# Patient Record
Sex: Female | Born: 2000 | Race: Black or African American | Hispanic: No | Marital: Single | Smoking: Former smoker
Health system: Southern US, Community
[De-identification: ages and names within clinical notes are randomized; demographics above are authoritative.]

## PROBLEM LIST (undated history)

## (undated) DIAGNOSIS — Z789 Other specified health status: Secondary | ICD-10-CM

## (undated) DIAGNOSIS — Z332 Encounter for elective termination of pregnancy: Secondary | ICD-10-CM

## (undated) DIAGNOSIS — J40 Bronchitis, not specified as acute or chronic: Secondary | ICD-10-CM

## (undated) HISTORY — PX: HERNIA REPAIR: SHX51

---
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
## 2019-01-20 ENCOUNTER — Emergency Department (HOSPITAL_COMMUNITY): Admission: EM | Admit: 2019-01-20 | Discharge: 2019-01-20 | Discharge status: Against Medical Advice | Payer: Self-pay

## 2019-09-19 ENCOUNTER — Emergency Department (HOSPITAL_COMMUNITY)
Admission: EM | Admit: 2019-09-19 | Discharge: 2019-09-19 | Disposition: A | Discharge status: Home / Self Care | Payer: Self-pay | Attending: Emergency Medicine | Admitting: Emergency Medicine

## 2019-09-19 ENCOUNTER — Encounter (HOSPITAL_COMMUNITY): Payer: Self-pay | Admitting: Emergency Medicine

## 2019-09-19 DIAGNOSIS — Z5321 Procedure and treatment not carried out due to patient leaving prior to being seen by health care provider: Secondary | ICD-10-CM | POA: Insufficient documentation

## 2019-09-19 DIAGNOSIS — N898 Other specified noninflammatory disorders of vagina: Secondary | ICD-10-CM | POA: Insufficient documentation

## 2019-09-19 NOTE — ED Triage Notes (Signed)
Pt reports she was treated for gonorrhea in January and states the discharge has not change of gotten better. Pt reports a white thin discharge, no odor. Denies any abd pain no urinary sxs.

## 2019-09-19 NOTE — ED Notes (Signed)
Pt states that she is leaving due to wait time.  

## 2019-10-25 ENCOUNTER — Inpatient Hospital Stay (HOSPITAL_COMMUNITY)
Admission: AD | Admit: 2019-10-25 | Discharge: 2019-10-25 | Disposition: A | Discharge status: Home / Self Care | Payer: Medicaid Other | Attending: Obstetrics & Gynecology | Admitting: Obstetrics & Gynecology

## 2019-10-25 ENCOUNTER — Other Ambulatory Visit: Payer: Self-pay

## 2019-10-25 DIAGNOSIS — R109 Unspecified abdominal pain: Secondary | ICD-10-CM | POA: Diagnosis not present

## 2019-10-25 LAB — POCT PREGNANCY, URINE: Preg Test, Ur: NEGATIVE

## 2019-10-25 NOTE — Progress Notes (Signed)
None     [name] is a 19 y.o. G1P0 non-pregnant female who presents to MAU today with complaint of abdominal pain.   O There were no vitals taken for this visit. Physical Exam  Constitutional: She appears well-developed and well-nourished. No distress.  Eyes: No scleral icterus.  Respiratory: Effort normal. No respiratory distress.  Neurological: She is alert. Coordination normal.  Skin: Skin is warm and dry. She is not diaphoretic.  Psychiatric: She has a normal mood and affect.    A Non pregnant female Medical screening exam complete  P Discharge from MAU in stable condition Patient given the option of transfer to [facility] for further evaluation or seek care in outpatient facility of choice List of options for follow-up given  Warning signs for worsening condition that would warrant emergency follow-up discussed Patient may return to MAU as needed for pregnancy related complaints  Venora Maples, MD 10/25/2019 4:23 AM

## 2019-11-28 ENCOUNTER — Emergency Department (HOSPITAL_COMMUNITY): Payer: Medicaid Other

## 2019-11-28 ENCOUNTER — Encounter (HOSPITAL_COMMUNITY): Payer: Self-pay | Admitting: Emergency Medicine

## 2019-11-28 ENCOUNTER — Emergency Department (HOSPITAL_COMMUNITY)
Admission: EM | Admit: 2019-11-28 | Discharge: 2019-11-28 | Disposition: A | Discharge status: Home / Self Care | Payer: Medicaid Other | Attending: Emergency Medicine | Admitting: Emergency Medicine

## 2019-11-28 DIAGNOSIS — R102 Pelvic and perineal pain: Secondary | ICD-10-CM

## 2019-11-28 DIAGNOSIS — R1011 Right upper quadrant pain: Secondary | ICD-10-CM | POA: Diagnosis present

## 2019-11-28 DIAGNOSIS — R1110 Vomiting, unspecified: Secondary | ICD-10-CM | POA: Insufficient documentation

## 2019-11-28 DIAGNOSIS — J302 Other seasonal allergic rhinitis: Secondary | ICD-10-CM | POA: Insufficient documentation

## 2019-11-28 DIAGNOSIS — R197 Diarrhea, unspecified: Secondary | ICD-10-CM | POA: Diagnosis not present

## 2019-11-28 DIAGNOSIS — R109 Unspecified abdominal pain: Secondary | ICD-10-CM

## 2019-11-28 DIAGNOSIS — M545 Low back pain: Secondary | ICD-10-CM | POA: Insufficient documentation

## 2019-11-28 LAB — URINALYSIS, ROUTINE W REFLEX MICROSCOPIC
Bilirubin Urine: NEGATIVE
Glucose, UA: NEGATIVE mg/dL
Hgb urine dipstick: NEGATIVE
Ketones, ur: NEGATIVE mg/dL
Nitrite: NEGATIVE
Protein, ur: NEGATIVE mg/dL
Specific Gravity, Urine: 1.027 (ref 1.005–1.030)
pH: 5 (ref 5.0–8.0)

## 2019-11-28 LAB — LIPASE, BLOOD: Lipase: 30 U/L (ref 11–51)

## 2019-11-28 LAB — CBC
HCT: 41.7 % (ref 36.0–46.0)
Hemoglobin: 13.3 g/dL (ref 12.0–15.0)
MCH: 27.4 pg (ref 26.0–34.0)
MCHC: 31.9 g/dL (ref 30.0–36.0)
MCV: 86 fL (ref 80.0–100.0)
Platelets: 286 10*3/uL (ref 150–400)
RBC: 4.85 MIL/uL (ref 3.87–5.11)
RDW: 12.5 % (ref 11.5–15.5)
WBC: 11.1 10*3/uL — ABNORMAL HIGH (ref 4.0–10.5)
nRBC: 0 % (ref 0.0–0.2)

## 2019-11-28 LAB — COMPREHENSIVE METABOLIC PANEL
ALT: 19 U/L (ref 0–44)
AST: 20 U/L (ref 15–41)
Albumin: 3.9 g/dL (ref 3.5–5.0)
Alkaline Phosphatase: 48 U/L (ref 38–126)
Anion gap: 8 (ref 5–15)
BUN: 13 mg/dL (ref 6–20)
CO2: 23 mmol/L (ref 22–32)
Calcium: 9.2 mg/dL (ref 8.9–10.3)
Chloride: 106 mmol/L (ref 98–111)
Creatinine, Ser: 0.68 mg/dL (ref 0.44–1.00)
GFR calc Af Amer: >60 mL/min (ref >60)
GFR calc non Af Amer: >60 mL/min (ref >60)
Glucose, Bld: 85 mg/dL (ref 70–99)
Potassium: 4.1 mmol/L (ref 3.5–5.1)
Sodium: 137 mmol/L (ref 135–145)
Total Bilirubin: 1.2 mg/dL (ref 0.3–1.2)
Total Protein: 7.5 g/dL (ref 6.5–8.1)

## 2019-11-28 LAB — WET PREP, GENITAL
Clue Cells Wet Prep HPF POC: NONE SEEN
Sperm: NONE SEEN
Trich, Wet Prep: NONE SEEN
Yeast Wet Prep HPF POC: NONE SEEN

## 2019-11-28 LAB — I-STAT BETA HCG BLOOD, ED (MC, WL, AP ONLY): I-stat hCG, quantitative: <5 m[IU]/mL (ref <5)

## 2019-11-28 LAB — RPR: RPR Ser Ql: NONREACTIVE

## 2019-11-28 MED ORDER — ONDANSETRON 4 MG PO TBDP: 4.0000 mg | ORAL_TABLET | Freq: Three times a day (TID) | ORAL | 0 refills | Status: DC | PRN

## 2019-11-28 MED ORDER — SODIUM CHLORIDE 0.9% FLUSH: 3.0000 mL | Freq: Once | INTRAVENOUS | Status: DC

## 2019-11-28 MED ORDER — LORATADINE 10 MG PO TABS: 10.0000 mg | ORAL_TABLET | Freq: Every day | ORAL | 0 refills | Status: DC

## 2019-11-28 NOTE — Discharge Instructions (Signed)
If you develop worsening, continued, or recurrent abdominal pain, uncontrolled vomiting, fever, chest or back pain, or any other new/concerning symptoms then return to the ER for evaluation.  

## 2019-11-28 NOTE — ED Notes (Signed)
Patient verbalizes understanding of discharge instructions. Opportunity for questioning and answers were provided. Armband removed by staff, pt discharged from ED. Ambulated out to lobby  

## 2019-11-28 NOTE — ED Provider Notes (Signed)
[facility] EMERGENCY DEPARTMENT Provider Note   CSN: [id number] Arrival date & time: 11/28/19  0753     History Chief Complaint  Patient presents with  . Abdominal Pain    Samantha Peters is a 19 y.o. female.  HPI 19 year old female presents with vomiting and right sided abdominal pain.  Vomiting and pain started a couple days ago.  Vomiting is intermittent and mostly at night/early morning.  Abdominal pain seems to come and go and often is present when she is ambulatory.  It is right upper and right lower.  Some mild diarrhea.  No blood in her emesis or diarrhea.  No fevers.  She also has been having trouble with allergies which is a chronic problem but she is off medicines right now.  Is having a lot of congestion/sneezing.  No sore throat, cough, or fever.  No current abdominal pain. Some low back pain. No vaginal symptoms or urinary symptoms.  History reviewed. No pertinent past medical history.  Patient Active Problem List   Diagnosis Date Noted  . [redacted] weeks gestation of pregnancy   . Encounter for fetal anatomic survey   . Poor fetal growth affecting management of mother in third trimester, antepartum     Past Surgical History:  Procedure Laterality Date  . HERNIA REPAIR       OB History    Gravida  1   Para      Term      Preterm      AB      Living        SAB      TAB      Ectopic      Multiple      Live Births              No family history on file.  Social History   Tobacco Use  . Smoking status: Never Smoker  Substance Use Topics  . Alcohol use: No  . Drug use: No    Home Medications Prior to Admission medications   Medication Sig Start Date End Date Taking? Authorizing Provider  loratadine (CLARITIN) 10 MG tablet Take 1 tablet (10 mg total) by mouth daily. One po daily x 5 days 11/28/19   Pricilla Loveless, MD  ondansetron (ZOFRAN ODT) 4 MG disintegrating tablet Take 1 tablet (4 mg total) by mouth every 8 (eight)  hours as needed for nausea or vomiting. 11/28/19   Pricilla Loveless, MD    Allergies    Patient has no known allergies.  Review of Systems   Review of Systems  Constitutional: Negative for fever.  HENT: Positive for congestion and sneezing. Negative for sore throat.   Respiratory: Negative for cough and shortness of breath.   Gastrointestinal: Positive for abdominal pain, diarrhea and vomiting.  Genitourinary: Negative for dysuria, hematuria, menstrual problem, vaginal bleeding and vaginal discharge.  Musculoskeletal: Positive for back pain.  All other systems reviewed and are negative.   Physical Exam Updated Vital Signs BP (!) 125/52 (BP Location: Right Arm)   Pulse 82   Temp 98 F (36.7 C) (Oral)   Resp 16   SpO2 100%   Physical Exam Vitals and nursing note reviewed. Exam conducted with a chaperone present.  Constitutional:      General: She is not in acute distress.    Appearance: She is well-developed. She is not ill-appearing or diaphoretic.  HENT:     Head: Normocephalic and atraumatic.  Right Ear: External ear normal.     Left Ear: External ear normal.     Nose: Nose normal.  Eyes:     General:        Right eye: No discharge.        Left eye: No discharge.  Cardiovascular:     Rate and Rhythm: Normal rate and regular rhythm.     Heart sounds: Normal heart sounds.  Pulmonary:     Effort: Pulmonary effort is normal.     Breath sounds: Normal breath sounds.  Abdominal:     Palpations: Abdomen is soft.     Tenderness: There is abdominal tenderness (mild) in the right upper quadrant and right lower quadrant. There is no right CVA tenderness or left CVA tenderness.  Genitourinary:    Vagina: No vaginal discharge or tenderness.     Cervix: No cervical motion tenderness or discharge.     Uterus: Not tender.      Adnexa:        Right: No mass or tenderness.         Left: No mass or tenderness.       Comments: Cervix mildly erythematous Skin:    General: Skin  is warm and dry.  Neurological:     Mental Status: She is alert.  Psychiatric:        Mood and Affect: Mood is not anxious.     ED Results / Procedures / Treatments   Labs (all labs ordered are listed, but only abnormal results are displayed) Labs Reviewed  WET PREP, GENITAL - Abnormal; Notable for the following components:      Result Value   WBC, Wet Prep HPF POC MANY (*)    All other components within normal limits  CBC - Abnormal; Notable for the following components:   WBC 11.1 (*)    All other components within normal limits  URINALYSIS, ROUTINE W REFLEX MICROSCOPIC - Abnormal; Notable for the following components:   APPearance HAZY (*)    Leukocytes,Ua SMALL (*)    Bacteria, UA FEW (*)    All other components within normal limits  LIPASE, BLOOD  COMPREHENSIVE METABOLIC PANEL  RPR  HIV ANTIBODY (ROUTINE TESTING W REFLEX)  I-STAT BETA HCG BLOOD, ED ([facility], [facility], [facility] ONLY)  GC/CHLAMYDIA PROBE AMP ([place]) NOT AT [facility]    EKG None  Radiology US PELVIC COMPLETE W TRANSVAGINAL AND TORSION R/O  Result Date: 11/28/2019 CLINICAL DATA:  Right lower quadrant pain. EXAM: TRANSABDOMINAL AND TRANSVAGINAL ULTRASOUND OF PELVIS DOPPLER ULTRASOUND OF OVARIES TECHNIQUE: Both transabdominal and transvaginal ultrasound examinations of the pelvis were performed. Transabdominal technique was performed for global imaging of the pelvis including uterus, ovaries, adnexal regions, and pelvic cul-de-sac. It was necessary to proceed with endovaginal exam following the transabdominal exam to visualize the ovaries and adnexa. Color and duplex Doppler ultrasound was utilized to evaluate blood flow to the ovaries. COMPARISON:  Pelvic ultrasound dated August 17, 2015. FINDINGS: Uterus Measurements: 7.9 x 3.3 x 5.1 cm = volume: 70 mL. No fibroids or other mass visualized. Endometrium Thickness: 8 mm.  No focal abnormality visualized. Right ovary Measurements: 3.1 x 1.4 x 1.7 cm = volume: 4 mL. Normal  appearance/no adnexal mass. Left ovary Measurements: 3.2 x 2.1 x 2.3 cm = volume: 8 mL. Normal appearance/no adnexal mass. Pulsed Doppler evaluation of both ovaries demonstrates normal low-resistance arterial and venous waveforms. Other findings No abnormal free fluid. IMPRESSION: Normal pelvic ultrasound. Electronically Signed   By: Huntley Dec  Derry M.D.   On: 11/28/2019 11:12   US Abdomen Limited RUQ  Result Date: 11/28/2019 CLINICAL DATA:  Acute right upper quadrant abdominal pain. EXAM: ULTRASOUND ABDOMEN LIMITED RIGHT UPPER QUADRANT COMPARISON:  None. FINDINGS: Gallbladder: No gallstones or wall thickening visualized. No sonographic Murphy sign noted by sonographer. Common bile duct: Diameter: 4 mm which is within normal limits. Liver: No focal lesion identified. Within normal limits in parenchymal echogenicity. Portal vein is patent on color Doppler imaging with normal direction of blood flow towards the liver. Other: None. IMPRESSION: No abnormality seen in the right upper quadrant of the abdomen. Electronically Signed   By: Lupita Raider M.D.   On: 11/28/2019 12:40    Procedures Procedures (including critical care time)  Medications Ordered in ED Medications  sodium chloride flush (NS) 0.9 % injection 3 mL (has no administration in time range)    ED Course  I have reviewed the triage vital signs and the nursing notes.  Pertinent labs & imaging results that were available during my care of the patient were reviewed by me and considered in my medical decision making (see chart for details).    MDM Rules/Calculators/A&P                      Given her right-sided pain/tenderness, work-up was obtained which included right upper quadrant ultrasound and pelvic ultrasound.  These were independently reviewed and did not show any obvious acute pathology.  Labs are overall unremarkable besides mild WBC elevation.  I discussed possibility of CT abdomen pelvis for further evaluation.   Appendicitis is still possible but seems unlikely in this presentation.  We discussed this but at this point she wants to go home.  She has no pain now and repeat abdominal exam shows no tenderness.  I think this is reasonable but cautioned that if her symptoms worsen she needs to come back.  Otherwise she does not have a specific concern for STI no cervical motion tenderness, will send GC/chlamydia cultures.  Urinalysis appears contaminated, do not think this is UTI Final Clinical Impression(s) / ED Diagnoses Final diagnoses:  Right sided abdominal pain  Seasonal allergies    Rx / DC Orders ED Discharge Orders         Ordered    ondansetron (ZOFRAN ODT) 4 MG disintegrating tablet  Every 8 hours PRN     11/28/19 1252    loratadine (CLARITIN) 10 MG tablet  Daily     11/28/19 1252           Pricilla Loveless, MD 11/28/19 1553

## 2019-11-28 NOTE — ED Triage Notes (Signed)
Pt reports RUQ and mid abdominal pain x3 days with emesis and diarrhea. Pt states she has only had 1 episode of emesis in the last 24 hours.

## 2019-11-29 LAB — GC/CHLAMYDIA PROBE AMP (~~LOC~~) NOT AT ARMC
Chlamydia: NEGATIVE
Comment: NEGATIVE
Comment: NORMAL
Neisseria Gonorrhea: NEGATIVE

## 2019-11-29 LAB — HIV ANTIBODY (ROUTINE TESTING W REFLEX): HIV Screen 4th Generation wRfx: NONREACTIVE

## 2019-12-01 ENCOUNTER — Telehealth (HOSPITAL_COMMUNITY): Payer: Self-pay

## 2019-12-29 ENCOUNTER — Other Ambulatory Visit: Payer: Self-pay

## 2019-12-29 ENCOUNTER — Inpatient Hospital Stay (HOSPITAL_COMMUNITY)
Admission: AD | Admit: 2019-12-29 | Discharge: 2019-12-30 | Disposition: A | Discharge status: Home / Self Care | Payer: Medicaid Other | Attending: Family Medicine | Admitting: Family Medicine

## 2019-12-29 DIAGNOSIS — Z3A01 Less than 8 weeks gestation of pregnancy: Secondary | ICD-10-CM | POA: Diagnosis not present

## 2019-12-29 DIAGNOSIS — O26891 Other specified pregnancy related conditions, first trimester: Secondary | ICD-10-CM | POA: Diagnosis not present

## 2019-12-29 DIAGNOSIS — O26851 Spotting complicating pregnancy, first trimester: Secondary | ICD-10-CM | POA: Insufficient documentation

## 2019-12-29 DIAGNOSIS — O26899 Other specified pregnancy related conditions, unspecified trimester: Secondary | ICD-10-CM

## 2019-12-29 DIAGNOSIS — O3680X0 Pregnancy with inconclusive fetal viability, not applicable or unspecified: Secondary | ICD-10-CM

## 2019-12-29 DIAGNOSIS — R102 Pelvic and perineal pain: Secondary | ICD-10-CM | POA: Diagnosis not present

## 2019-12-29 DIAGNOSIS — O209 Hemorrhage in early pregnancy, unspecified: Secondary | ICD-10-CM

## 2019-12-29 DIAGNOSIS — R109 Unspecified abdominal pain: Secondary | ICD-10-CM | POA: Diagnosis not present

## 2019-12-29 DIAGNOSIS — Z79899 Other long term (current) drug therapy: Secondary | ICD-10-CM | POA: Insufficient documentation

## 2019-12-29 HISTORY — DX: Other specified health status: Z78.9

## 2019-12-29 NOTE — ED Triage Notes (Signed)
Pt said she is about 3 to [redacted] week pregnant and has been spotting x 3 days with abdominal discomfort. Pt said dr is in [place]. Pt said no nausea, no vomiting. Squeezing sensation in her lower abdomen

## 2019-12-30 ENCOUNTER — Inpatient Hospital Stay (HOSPITAL_COMMUNITY): Payer: Medicaid Other

## 2019-12-30 ENCOUNTER — Encounter: Payer: Self-pay | Admitting: Advanced Practice Midwife

## 2019-12-30 DIAGNOSIS — O26891 Other specified pregnancy related conditions, first trimester: Secondary | ICD-10-CM

## 2019-12-30 DIAGNOSIS — Z3A01 Less than 8 weeks gestation of pregnancy: Secondary | ICD-10-CM

## 2019-12-30 DIAGNOSIS — O3680X0 Pregnancy with inconclusive fetal viability, not applicable or unspecified: Secondary | ICD-10-CM

## 2019-12-30 DIAGNOSIS — R102 Pelvic and perineal pain: Secondary | ICD-10-CM

## 2019-12-30 LAB — URINALYSIS, ROUTINE W REFLEX MICROSCOPIC
Bilirubin Urine: NEGATIVE
Glucose, UA: NEGATIVE mg/dL
Hgb urine dipstick: NEGATIVE
Ketones, ur: NEGATIVE mg/dL
Nitrite: NEGATIVE
Protein, ur: 30 mg/dL — AB
Specific Gravity, Urine: 1.036 — ABNORMAL HIGH (ref 1.005–1.030)
WBC, UA: >50 WBC/hpf — ABNORMAL HIGH (ref 0–5)
pH: 5 (ref 5.0–8.0)

## 2019-12-30 LAB — WET PREP, GENITAL
Sperm: NONE SEEN
Trich, Wet Prep: NONE SEEN
Yeast Wet Prep HPF POC: NONE SEEN

## 2019-12-30 LAB — CBC
HCT: 37.8 % (ref 36.0–46.0)
Hemoglobin: 12.5 g/dL (ref 12.0–15.0)
MCH: 27.8 pg (ref 26.0–34.0)
MCHC: 33.1 g/dL (ref 30.0–36.0)
MCV: 84 fL (ref 80.0–100.0)
Platelets: 273 10*3/uL (ref 150–400)
RBC: 4.5 MIL/uL (ref 3.87–5.11)
RDW: 12.8 % (ref 11.5–15.5)
WBC: 12.3 10*3/uL — ABNORMAL HIGH (ref 4.0–10.5)
nRBC: 0 % (ref 0.0–0.2)

## 2019-12-30 LAB — ABO/RH: ABO/RH(D): B POS

## 2019-12-30 LAB — HCG, QUANTITATIVE, PREGNANCY: hCG, Beta Chain, Quant, S: 47 m[IU]/mL — ABNORMAL HIGH (ref <5)

## 2019-12-30 LAB — POC URINE PREG, ED: Preg Test, Ur: POSITIVE — AB

## 2019-12-30 MED ORDER — ACETAMINOPHEN 325 MG PO TABS
650.0000 mg | ORAL_TABLET | Freq: Once | ORAL | Status: AC
  Administered 2019-12-30: 650 mg via ORAL
  Filled 2019-12-30: qty 2

## 2019-12-30 NOTE — MAU Note (Signed)
Cramping and spotting for 3 days. LMP 12/11/19

## 2019-12-30 NOTE — MAU Note (Signed)
Pt d/c home by Samson Frederic RN

## 2019-12-30 NOTE — Discharge Instructions (Signed)

## 2019-12-30 NOTE — ED Notes (Signed)
Pt was ok by Samantha Peters for MAU. PA called and spoke with the nurses over there.

## 2019-12-30 NOTE — ED Provider Notes (Signed)
MSE was initiated and I personally evaluated the patient and placed orders (if any) at  12:02 AM on December 30, 2019.  Samantha Peters is a 19 y.o. female, presenting to the ED with lower abdominal pain for the last 2 days.  Pain is cramping, radiating to the right lower back, intermittent.  Accompanied by vaginal spotting. Denies fever, vomiting, diarrhea, urinary symptoms. States she is pregnant with LMP at the end of May.  She recently moved from [place] and has not yet established care with a local physician.   Physical Exam:  No diaphoresis.  No pallor.  Pulmonary: No increased work of breathing.  Speaks in full sentences without difficulty. No tachypnea.   Cardiac: Normal rate and regular.   Abdominal: Tenderness mostly in the suprapubic region of the abdomen.  No peritoneal signs.  No rebound tenderness.  No guarding.     12:08 AM Spoke with Hilda Lias, MAU provider. Discussed patient symptoms. Agrees to accept patient.  Abnormal Labs Reviewed  POC URINE PREG, ED - Abnormal; Notable for the following components:      Result Value   Preg Test, Ur POSITIVE (*)    All other components within normal limits    The patient appears stable so that the remainder of the MSE may be completed by another provider.   Concepcion Living 12/30/19 0010    Palumbo, April, MD 12/30/19 [phone]

## 2019-12-30 NOTE — MAU Provider Note (Signed)
Chief Complaint: Vaginal Bleeding (pregnant)   First Provider Initiated Contact with Patient 12/30/19 0215        SUBJECTIVE HPI: Samantha Peters is a 19 y.o. [id number] at [redacted]w[redacted]d by LMP who presents to maternity admissions reporting positive pregnancy test, cramping and spotting for three days.  Had a negative I-Stat HCG on 11/28/19.  .She denies vaginal itching/burning, urinary symptoms, h/a, dizziness, n/v, or fever/chills.    Vaginal Bleeding The patient's primary symptoms include pelvic pain and vaginal bleeding. The patient's pertinent negatives include no genital itching, genital lesions or genital odor. This is a new problem. The current episode started in the past 7 days. The problem occurs intermittently. The problem has been unchanged. The pain is mild. She is pregnant. Associated symptoms include abdominal pain. Pertinent negatives include no chills, constipation, diarrhea, fever, headaches, nausea or vomiting. The vaginal discharge was bloody. The vaginal bleeding is spotting. She has not been passing clots. She has not been passing tissue. Nothing aggravates the symptoms. She has tried nothing for the symptoms.   RN Note: Cramping and spotting for 3 days. LMP 12/11/19  Past Medical History:  Diagnosis Date  . Medical history non-contributory    Past Surgical History:  Procedure Laterality Date  . HERNIA REPAIR     Social History   Socioeconomic History  . Marital status: Single    Spouse name: Not on file  . Number of children: Not on file  . Years of education: Not on file  . Highest education level: Not on file  Occupational History  . Not on file  Tobacco Use  . Smoking status: Never Smoker  . Smokeless tobacco: Never Used  Vaping Use  . Vaping Use: Never used  Substance and Sexual Activity  . Alcohol use: No  . Drug use: No  . Sexual activity: Not on file  Other Topics Concern  . Not on file  Social History Narrative  . Not on file   Social Determinants of  Health   Financial Resource Strain:   . Difficulty of Paying Living Expenses:   Food Insecurity:   . Worried About Charity fundraiser in the Last Year:   . Arboriculturist in the Last Year:   Transportation Needs:   . Film/video editor (Medical):   Marland Kitchen Lack of Transportation (Non-Medical):   Physical Activity:   . Days of Exercise per Week:   . Minutes of Exercise per Session:   Stress:   . Feeling of Stress :   Social Connections:   . Frequency of Communication with Friends and Family:   . Frequency of Social Gatherings with Friends and Family:   . Attends Religious Services:   . Active Member of Clubs or Organizations:   . Attends Archivist Meetings:   Marland Kitchen Marital Status:   Intimate Partner Violence:   . Fear of Current or Ex-Partner:   . Emotionally Abused:   Marland Kitchen Physically Abused:   . Sexually Abused:    No current facility-administered medications on file prior to encounter.   Current Outpatient Medications on File Prior to Encounter  Medication Sig Dispense Refill  . loratadine (CLARITIN) 10 MG tablet Take 1 tablet (10 mg total) by mouth daily. One po daily x 5 days 30 tablet 0  . ondansetron (ZOFRAN ODT) 4 MG disintegrating tablet Take 1 tablet (4 mg total) by mouth every 8 (eight) hours as needed for nausea or vomiting. 10 tablet 0   No Known Allergies  I have reviewed patient's Past Medical Hx, Surgical Hx, Family Hx, Social Hx, medications and allergies.   ROS:  Review of Systems  Constitutional: Negative for chills and fever.  Gastrointestinal: Positive for abdominal pain. Negative for constipation, diarrhea, nausea and vomiting.  Genitourinary: Positive for pelvic pain and vaginal bleeding.  Neurological: Negative for headaches.   Review of Systems  Other systems negative   Physical Exam  Physical Exam Patient Vitals for the past 24 hrs:  BP Temp Temp src Pulse Resp SpO2 Height Weight  12/30/19 0121 124/66 98.5 F (36.9 C) -- 75 20 -- 5'  1" (1.549 m) 65.8 kg  12/29/19 2320 (!) 116/93 98.1 F (36.7 C) Oral 78 20 99 % -- --   Constitutional: Well-developed, well-nourished female in no acute distress.  Cardiovascular: normal rate Respiratory: normal effort GI: Abd soft, non-tender. Pos BS x 4 MS: Extremities nontender, no edema, normal ROM Neurologic: Alert and oriented x 4.  GU: Neg CVAT.  PELVIC EXAM: Cervix pink, visually closed, without lesion, scant white creamy discharge, vaginal walls and external genitalia normal   No blood visibe Bimanual exam: Cervix 0/long/high, firm, anterior, neg CMT, uterus tender, nonenlarged, adnexa with bilateral tenderness, enlargement, or mass   LAB RESULTS Results for orders placed or performed during the hospital encounter of 12/29/19 (from the past 24 hour(s))  POC Urine Pregnancy, ED (not at [facility])     Status: Abnormal   Collection Time: 12/29/19 11:54 PM  Result Value Ref Range   Preg Test, Ur POSITIVE (A) NEGATIVE  CBC     Status: Abnormal   Collection Time: 12/30/19 12:59 AM  Result Value Ref Range   WBC 12.3 (H) 4.0 - 10.5 K/uL   RBC 4.50 3.87 - 5.11 MIL/uL   Hemoglobin 12.5 12.0 - 15.0 g/dL   HCT 74.2 36 - 46 %   MCV 84.0 80.0 - 100.0 fL   MCH 27.8 26.0 - 34.0 pg   MCHC 33.1 30.0 - 36.0 g/dL   RDW 59.5 63.8 - 75.6 %   Platelets 273 150 - 400 K/uL   nRBC 0.0 0.0 - 0.2 %  hCG, quantitative, pregnancy     Status: Abnormal   Collection Time: 12/30/19 12:59 AM  Result Value Ref Range   hCG, Beta Chain, Quant, S 47 (H) <5 mIU/mL  ABO/Rh     Status: None   Collection Time: 12/30/19 12:59 AM  Result Value Ref Range   ABO/RH(D) B POS    No rh immune globuloin      NOT A RH IMMUNE GLOBULIN CANDIDATE, PT RH POSITIVE Performed at [facility], [address], [place], [place] [address]     --/--/B POS (06/11 0059)  IMAGING US OB Comp Less 14 Wks  Result Date: 12/30/2019 CLINICAL DATA:  Pelvic pain with bleeding. EXAM: OBSTETRIC <14 WK [place] AND TRANSVAGINAL OB US  TECHNIQUE: Both transabdominal and transvaginal ultrasound examinations were performed for complete evaluation of the gestation as well as the maternal uterus, adnexal regions, and pelvic cul-de-sac. Transvaginal technique was performed to assess early pregnancy. COMPARISON:  None. FINDINGS: No intrauterine pregnancy was identified. Maternal uterus/adnexae: There is a probable 2.2 cm corpus luteal cyst involving the right ovary. The left ovary is unremarkable. There is a moderate amount of free fluid in the patient's pelvis. IMPRESSION: No IUP is visualized. By definition, in the setting of a positive pregnancy test, this reflects a pregnancy of unknown location. Differential considerations include early normal IUP, abnormal IUP/missed abortion,  or nonvisualized ectopic pregnancy. Serial beta HCG is suggested. Consider repeat pelvic ultrasound in 14 days. There is a moderate volume of pelvic free fluid. Electronically Signed   By: Katherine Mantle M.D.   On: 12/30/2019 03:09   US OB Transvaginal  Result Date: 12/30/2019 CLINICAL DATA:  Pelvic pain with bleeding. EXAM: OBSTETRIC <14 WK [place] AND TRANSVAGINAL OB US TECHNIQUE: Both transabdominal and transvaginal ultrasound examinations were performed for complete evaluation of the gestation as well as the maternal uterus, adnexal regions, and pelvic cul-de-sac. Transvaginal technique was performed to assess early pregnancy. COMPARISON:  None. FINDINGS: No intrauterine pregnancy was identified. Maternal uterus/adnexae: There is a probable 2.2 cm corpus luteal cyst involving the right ovary. The left ovary is unremarkable. There is a moderate amount of free fluid in the patient's pelvis. IMPRESSION: No IUP is visualized. By definition, in the setting of a positive pregnancy test, this reflects a pregnancy of unknown location. Differential considerations include early normal IUP, abnormal IUP/missed abortion, or nonvisualized ectopic pregnancy. Serial beta HCG is  suggested. Consider repeat pelvic ultrasound in 14 days. There is a moderate volume of pelvic free fluid. Electronically Signed   By: Katherine Mantle M.D.   On: 12/30/2019 03:09     MAU Management/MDM: Ordered usual first trimester r/o ectopic labs.   Pelvic exam and cultures done Will check baseline Ultrasound to rule out ectopic.  This bleeding/pain can represent a normal pregnancy with bleeding, spontaneous abortion or even an ectopic which can be life-threatening.  The process as listed above helps to determine which of these is present.  Reviewed results.  There was no IUP seen on [place] which was expected.  Discussed with a HCG this low, this may represent a very early pregnancy or failing pregnancy  ASSESSMENT 1. Vaginal bleeding in pregnancy, first trimester   2. Pelvic pain affecting pregnancy   3. Vaginal bleeding in pregnancy, first trimester   4. Pelvic pain affecting pregnancy   Pregnancy of unknown anatomic location at [redacted]w[redacted]d by LMP  PLAN Discharge home Plan to repeat HCG level in 48 hours in MAU Will repeat  Ultrasound in about 7-10 days if HCG levels double appropriately  Ectopic precautions  Pt stable at time of discharge. Encouraged to return here or to other Urgent Care/ED if she develops worsening of symptoms, increase in pain, fever, or other concerning symptoms.    Wynelle Bourgeois CNM, MSN Certified Nurse-Midwife 12/30/2019  2:23 AM

## 2020-01-02 LAB — GC/CHLAMYDIA PROBE AMP (~~LOC~~) NOT AT ARMC
Chlamydia: POSITIVE — AB
Comment: NEGATIVE
Comment: NORMAL
Neisseria Gonorrhea: NEGATIVE

## 2020-01-03 ENCOUNTER — Telehealth: Payer: Self-pay | Admitting: Student

## 2020-01-03 DIAGNOSIS — A749 Chlamydial infection, unspecified: Secondary | ICD-10-CM

## 2020-01-03 MED ORDER — AZITHROMYCIN 500 MG PO TABS: 1000.0000 mg | ORAL_TABLET | Freq: Once | ORAL | 0 refills | Status: AC

## 2020-01-03 MED FILL — AZITHROMYCIN 500 MG TABLET: 500 | 1 days supply | Qty: 2 | Fill #0

## 2020-01-03 NOTE — Telephone Encounter (Addendum)
-----   Message from Kathe Becton, RN sent at 01/03/2020 11:18 AM EDT ----- This patient tested positive for :  chlamydia  She "has NKDA",I have informed the patient of her results and confirmed her pharmacy is correct in her chart. Please send Rx.   Thank you,   Kathe Becton, RN   Results faxed to [facility] Department.       Samantha Peters tested positive for  Chlamydia. Patient was called by RN and allergies and pharmacy confirmed. Rx sent to pharmacy of choice.   Judeth Horn, NP 01/03/2020 12:15 PM

## 2020-03-04 ENCOUNTER — Other Ambulatory Visit: Payer: Self-pay

## 2020-03-04 ENCOUNTER — Emergency Department (HOSPITAL_COMMUNITY)
Admission: EM | Admit: 2020-03-04 | Discharge: 2020-03-04 | Disposition: A | Discharge status: Home / Self Care | Payer: Medicaid Other | Attending: Emergency Medicine | Admitting: Emergency Medicine

## 2020-03-04 ENCOUNTER — Encounter (HOSPITAL_COMMUNITY): Payer: Self-pay | Admitting: Emergency Medicine

## 2020-03-04 DIAGNOSIS — R1030 Lower abdominal pain, unspecified: Secondary | ICD-10-CM | POA: Diagnosis present

## 2020-03-04 DIAGNOSIS — R300 Dysuria: Secondary | ICD-10-CM | POA: Diagnosis not present

## 2020-03-04 DIAGNOSIS — Z5321 Procedure and treatment not carried out due to patient leaving prior to being seen by health care provider: Secondary | ICD-10-CM | POA: Insufficient documentation

## 2020-03-04 HISTORY — DX: Encounter for elective termination of pregnancy: Z33.2

## 2020-03-04 LAB — COMPREHENSIVE METABOLIC PANEL
ALT: 12 U/L (ref 0–44)
AST: 14 U/L — ABNORMAL LOW (ref 15–41)
Albumin: 4.1 g/dL (ref 3.5–5.0)
Alkaline Phosphatase: 41 U/L (ref 38–126)
Anion gap: 8 (ref 5–15)
BUN: 8 mg/dL (ref 6–20)
CO2: 26 mmol/L (ref 22–32)
Calcium: 9.6 mg/dL (ref 8.9–10.3)
Chloride: 105 mmol/L (ref 98–111)
Creatinine, Ser: 0.59 mg/dL (ref 0.44–1.00)
GFR calc Af Amer: >60 mL/min (ref >60)
GFR calc non Af Amer: >60 mL/min (ref >60)
Glucose, Bld: 87 mg/dL (ref 70–99)
Potassium: 4.2 mmol/L (ref 3.5–5.1)
Sodium: 139 mmol/L (ref 135–145)
Total Bilirubin: 1.8 mg/dL — ABNORMAL HIGH (ref 0.3–1.2)
Total Protein: 7.4 g/dL (ref 6.5–8.1)

## 2020-03-04 LAB — CBC
HCT: 42.1 % (ref 36.0–46.0)
Hemoglobin: 13 g/dL (ref 12.0–15.0)
MCH: 26.7 pg (ref 26.0–34.0)
MCHC: 30.9 g/dL (ref 30.0–36.0)
MCV: 86.6 fL (ref 80.0–100.0)
Platelets: 323 10*3/uL (ref 150–400)
RBC: 4.86 MIL/uL (ref 3.87–5.11)
RDW: 13.5 % (ref 11.5–15.5)
WBC: 8.4 10*3/uL (ref 4.0–10.5)
nRBC: 0 % (ref 0.0–0.2)

## 2020-03-04 LAB — URINALYSIS, ROUTINE W REFLEX MICROSCOPIC
Bacteria, UA: NONE SEEN
Bilirubin Urine: NEGATIVE
Glucose, UA: NEGATIVE mg/dL
Hgb urine dipstick: NEGATIVE
Ketones, ur: 20 mg/dL — AB
Nitrite: NEGATIVE
Protein, ur: 30 mg/dL — AB
Specific Gravity, Urine: 1.031 — ABNORMAL HIGH (ref 1.005–1.030)
pH: 6 (ref 5.0–8.0)

## 2020-03-04 LAB — I-STAT BETA HCG BLOOD, ED (MC, WL, AP ONLY): I-stat hCG, quantitative: 55 m[IU]/mL — ABNORMAL HIGH (ref <5)

## 2020-03-04 LAB — LIPASE, BLOOD: Lipase: 22 U/L (ref 11–51)

## 2020-03-04 NOTE — ED Triage Notes (Signed)
Pt reports she had elective abortion 1 month ago.  Reports generalized abd pain that is worse to lower abd and burning with urination.

## 2020-03-04 NOTE — ED Notes (Signed)
Pt didn't answer when called on the lobby

## 2020-07-20 IMAGING — US US PELVIS COMPLETE TRANSABD/TRANSVAG W DUPLEX
1 series · 13 of 25 positions shown · non-contrast
Comparison: Pelvic ultrasound dated August 17, 2015.

CLINICAL DATA: Right lower quadrant pain.

EXAM:
TRANSABDOMINAL AND TRANSVAGINAL ULTRASOUND OF PELVIS
DOPPLER ULTRASOUND OF OVARIES
TECHNIQUE: Both transabdominal and transvaginal ultrasound examinations of the
pelvis were performed. Transabdominal technique was performed for
global imaging of the pelvis including uterus, ovaries, adnexal
regions, and pelvic cul-de-sac.
It was necessary to proceed with endovaginal exam following the
transabdominal exam to visualize the ovaries and adnexa. Color and
duplex Doppler ultrasound was utilized to evaluate blood flow to the
ovaries.

[Series 1: us pelvic complete w transvaginal and torsion righ · 13 of 36 slices shown]
[im 1/36]
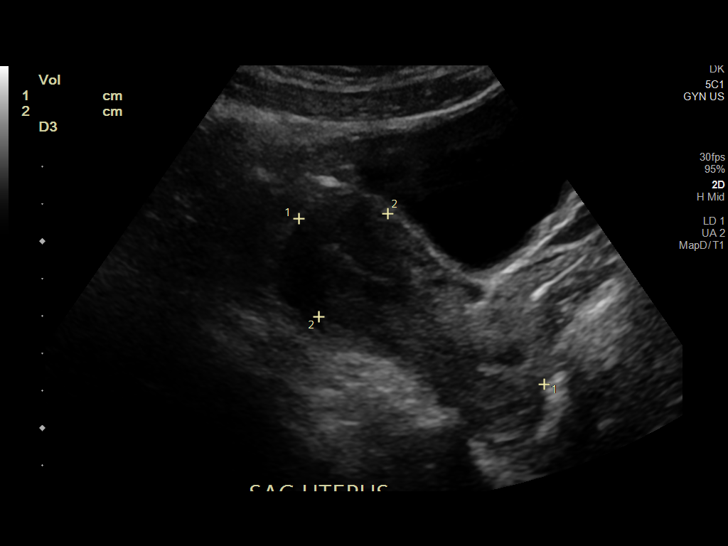
[im 3/36]
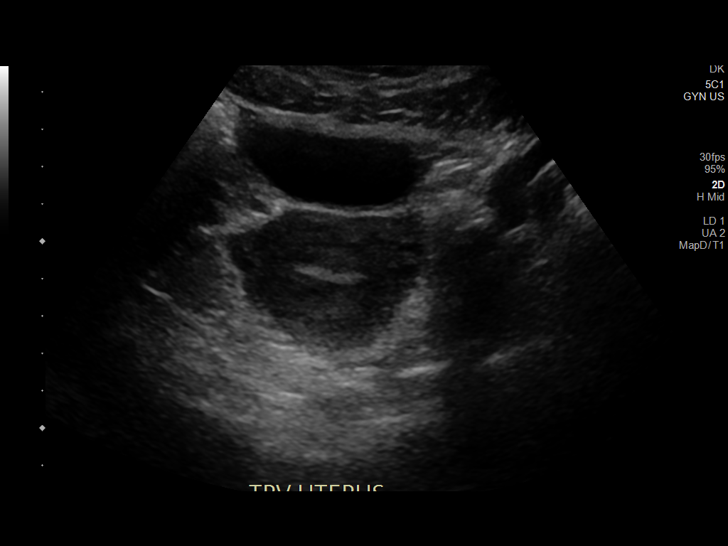
[im 6/36]
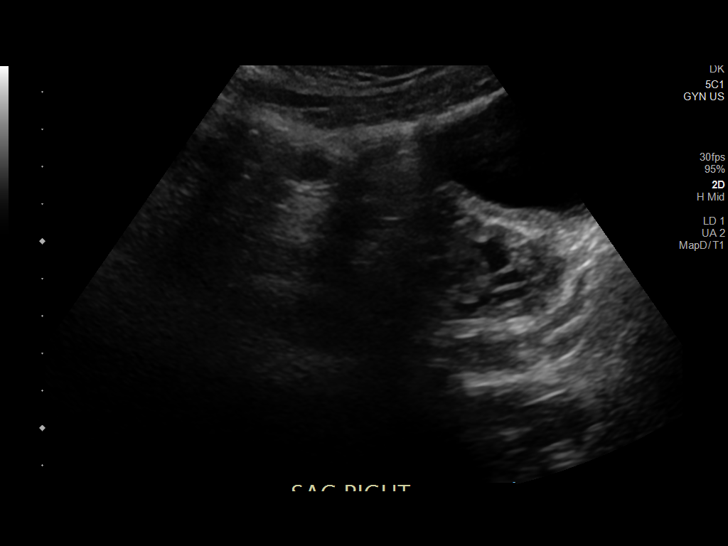
[im 9/36]
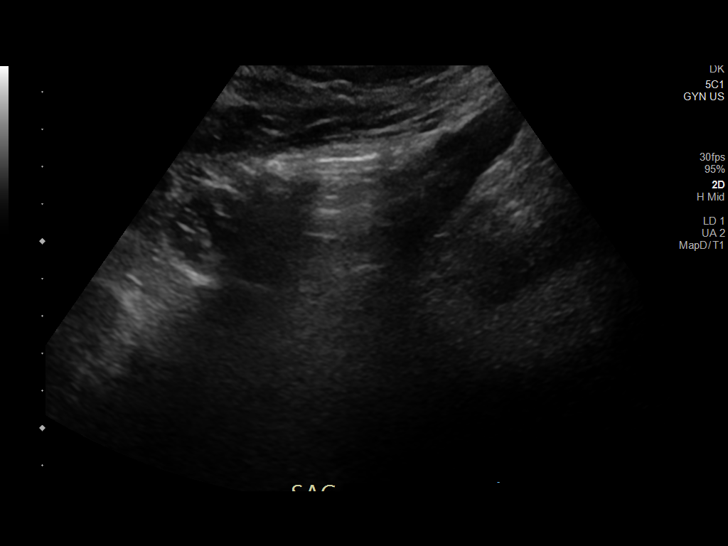
[im 12/36]
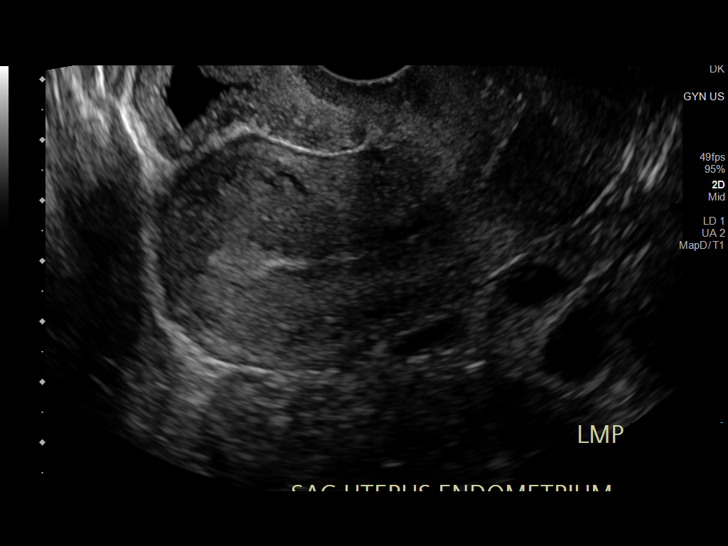
[im 15/36]
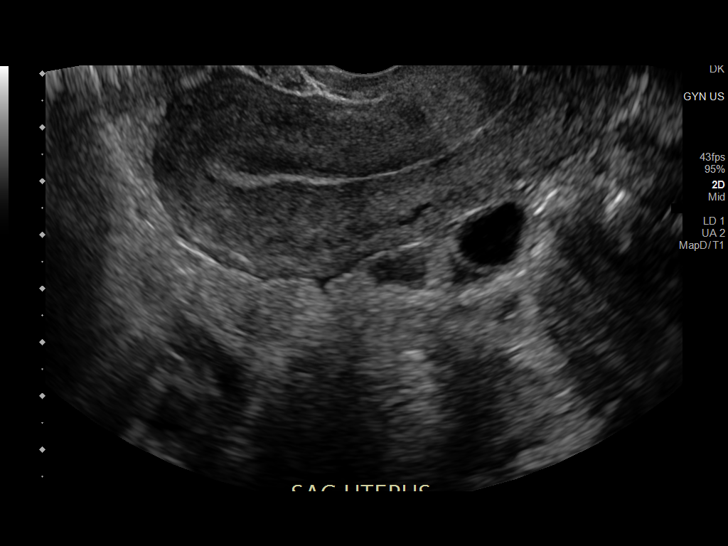
[im 18/36]
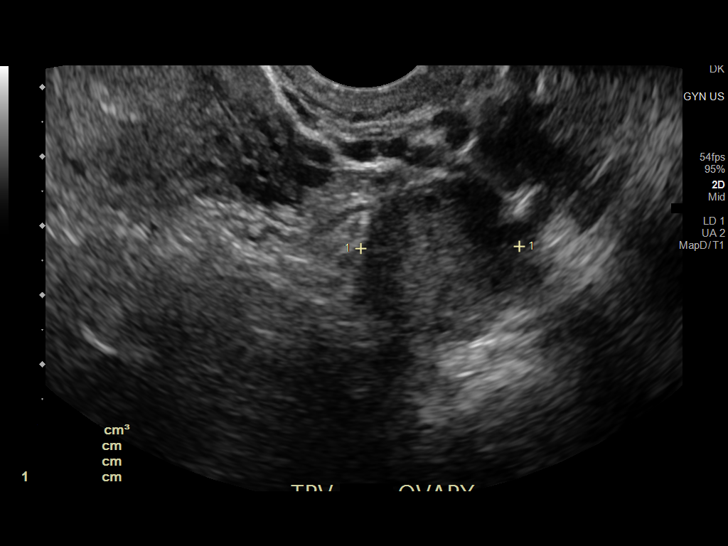
[im 21/36]
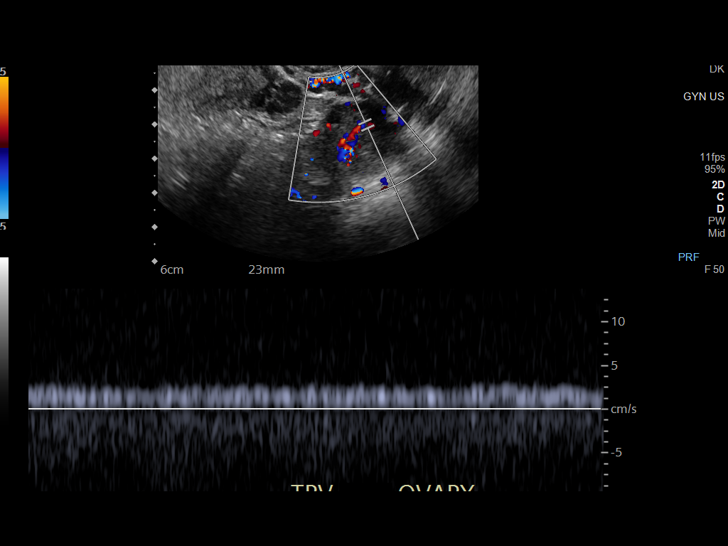
[im 24/36]
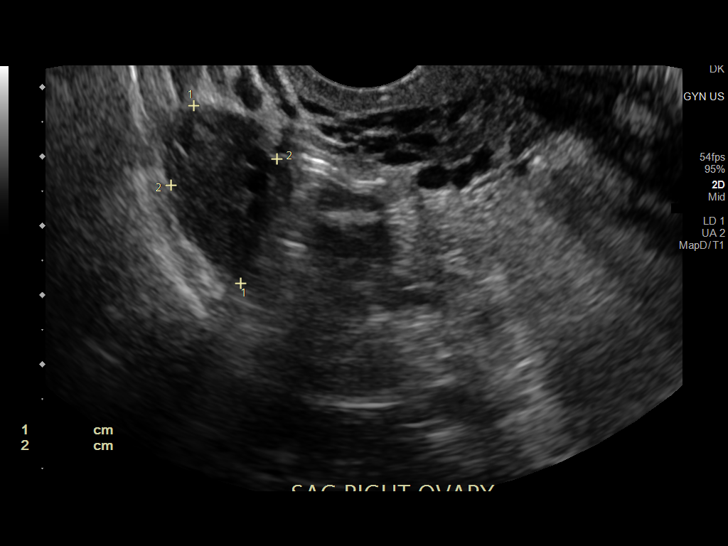
[im 27/36]
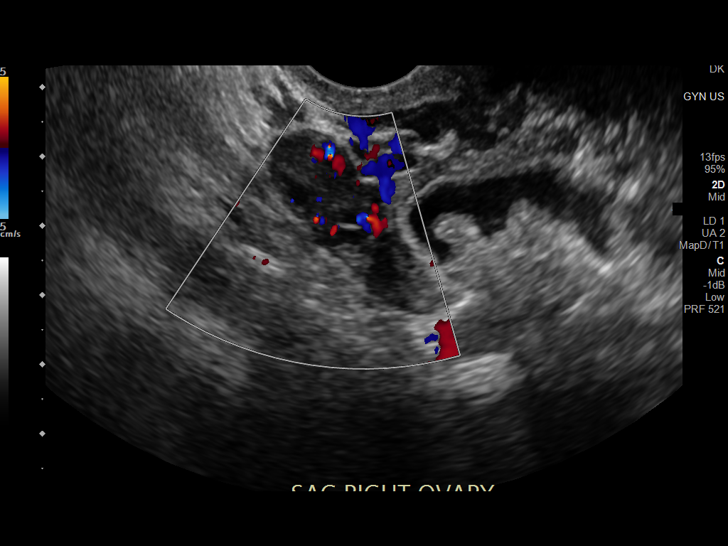
[im 30/36]
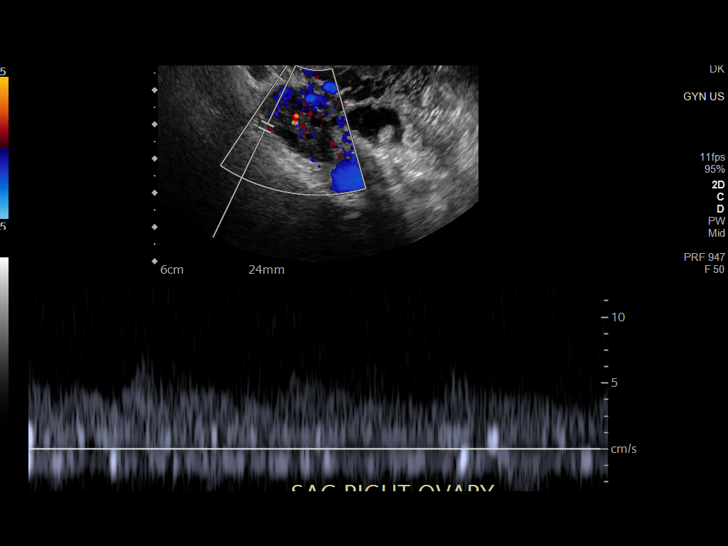
[im 33/36]
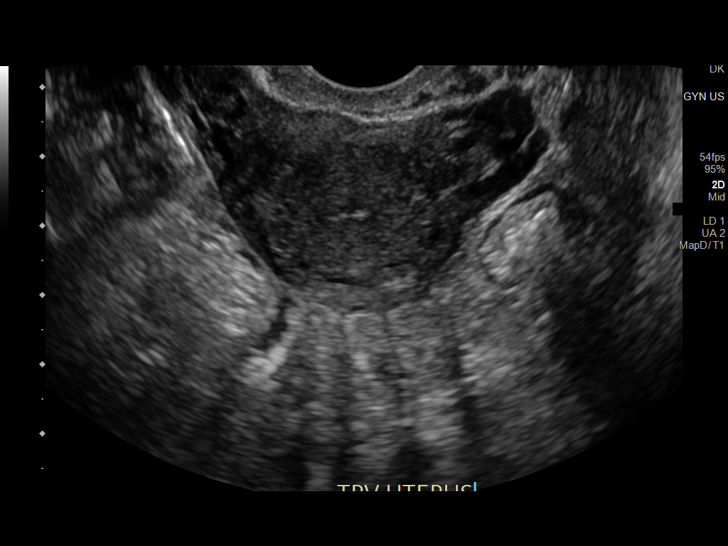
[im 36/36]
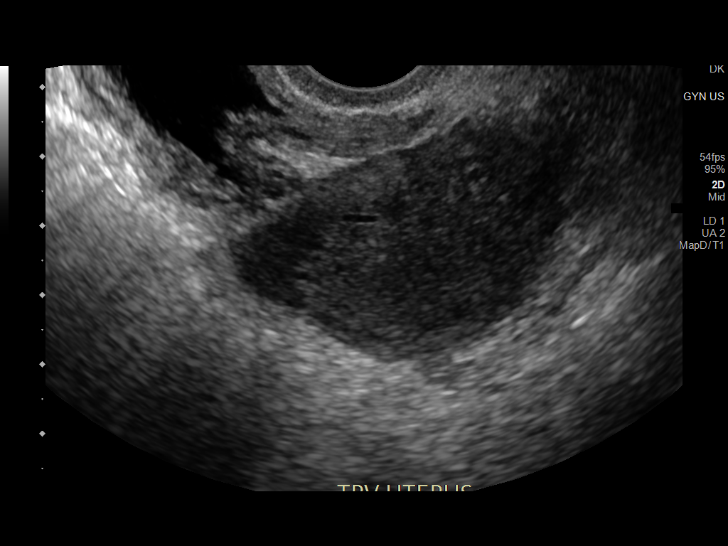

[13 of 25 positions shown; findings below may reference images not displayed]

FINDINGS: Uterus

Measurements: 7.9 x 3.3 x 5.1 cm = volume: 70 mL. No fibroids or
other mass visualized.

Endometrium

Thickness: 8 mm.  No focal abnormality visualized.

Right ovary

Measurements: 3.1 x 1.4 x 1.7 cm = volume: 4 mL. Normal
appearance/no adnexal mass.

Left ovary

Measurements: 3.2 x 2.1 x 2.3 cm = volume: 8 mL. Normal
appearance/no adnexal mass.

Pulsed Doppler evaluation of both ovaries demonstrates normal
low-resistance arterial and venous waveforms.

Other findings

No abnormal free fluid.
IMPRESSION: Normal pelvic ultrasound.

## 2020-08-05 ENCOUNTER — Telehealth: Payer: Medicaid Other | Admitting: Family

## 2020-08-05 DIAGNOSIS — R1030 Lower abdominal pain, unspecified: Secondary | ICD-10-CM

## 2020-08-05 DIAGNOSIS — N898 Other specified noninflammatory disorders of vagina: Secondary | ICD-10-CM

## 2020-08-05 NOTE — Progress Notes (Signed)
Based on what you shared with me, I feel your condition warrants further evaluation and I recommend that you be seen for a face to face office visit.   Given your symptoms you need to be seen face to face for further testing.    NOTE: If you entered your credit card information for this eVisit, you will not be charged. You may see a "hold" on your card for the $35 but that hold will drop off and you will not have a charge processed.   If you are having a true medical emergency please call 911.      For an urgent face to face visit, [place] has five urgent care centers for your convenience:     [facility] at [facility] Directions [phone] [address] [place], [place] [address] . 10 am - 6pm Monday - Friday    [facility]) Get Driving Directions [phone] [address] [place], [place] [address] . 10 am to 8 pm Monday-Friday . 12 pm to 8 pm [facility] Urgent Care at [facility] Get Driving Directions [phone] [address], [address] [place], [place] [address] . 8 am to 8 pm Monday-Friday . 9 am to 6 pm Saturday . 11 am to 6 pm Sunday     [facility] at [facility] Get Driving Directions  [phone] [address] [place], [place] [address] . 8 am to 8 pm Monday-Friday . 8 am to 4 pm [facility] at [facility] Directions [phone] [address] [place], [place] [address] . 12 pm to 6 pm Monday-Friday      Your e-visit answers were reviewed by a board certified advanced clinical practitioner to complete your personal care plan.  Thank you for using e-Visits.

## 2020-08-08 ENCOUNTER — Ambulatory Visit: Payer: Medicaid Other | Admitting: Physician Assistant

## 2020-08-15 ENCOUNTER — Ambulatory Visit: Payer: Medicaid Other | Admitting: Physician Assistant

## 2020-08-20 ENCOUNTER — Ambulatory Visit: Payer: Self-pay | Admitting: Family Medicine

## 2020-08-21 IMAGING — US US OB COMP LESS 14 WK
1 series · 15 of 28 positions shown · non-contrast
Comparison: None.

CLINICAL DATA: Pelvic pain with bleeding.

EXAM:
OBSTETRIC <14 WK US AND TRANSVAGINAL OB US
TECHNIQUE: Both transabdominal and transvaginal ultrasound examinations were
performed for complete evaluation of the gestation as well as the
maternal uterus, adnexal regions, and pelvic cul-de-sac.
Transvaginal technique was performed to assess early pregnancy.

[Series 1: us ob comp less 14 wk · 15 of 62 slices shown]
[im 1/62]
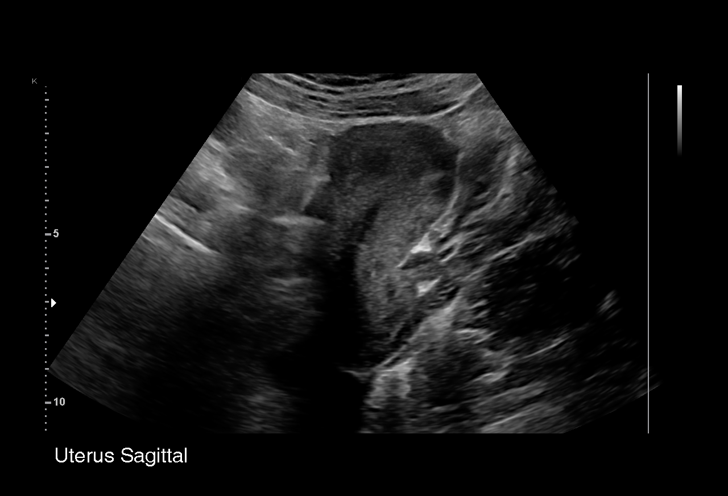
[im 5/62]
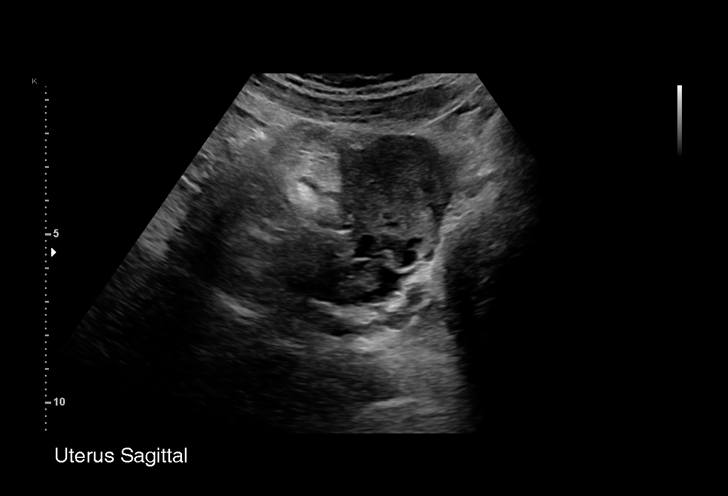
[im 10/62]
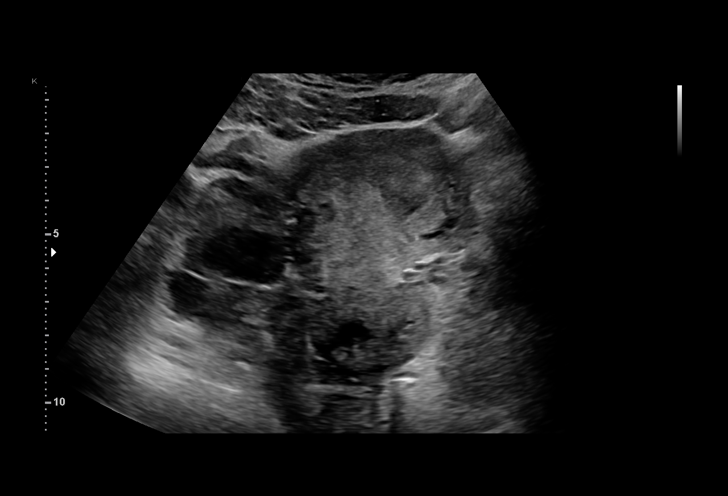
[im 14/62]
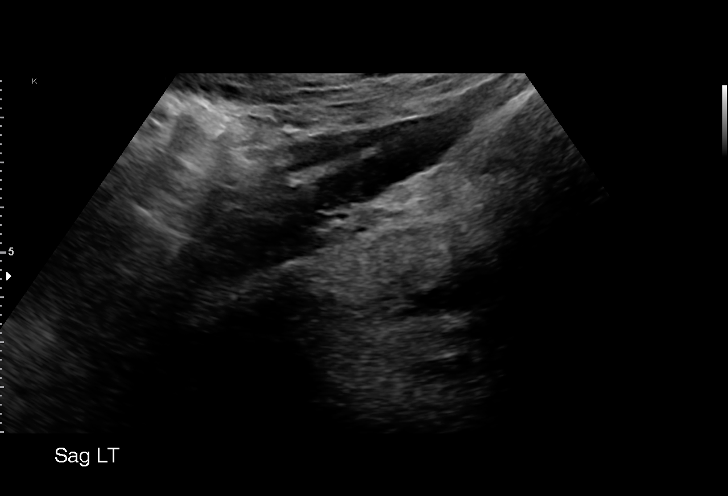
[im 19/62]
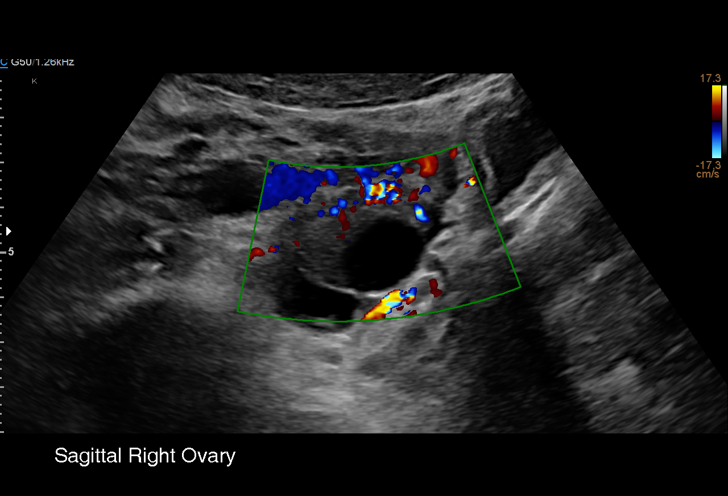
[im 23/62]
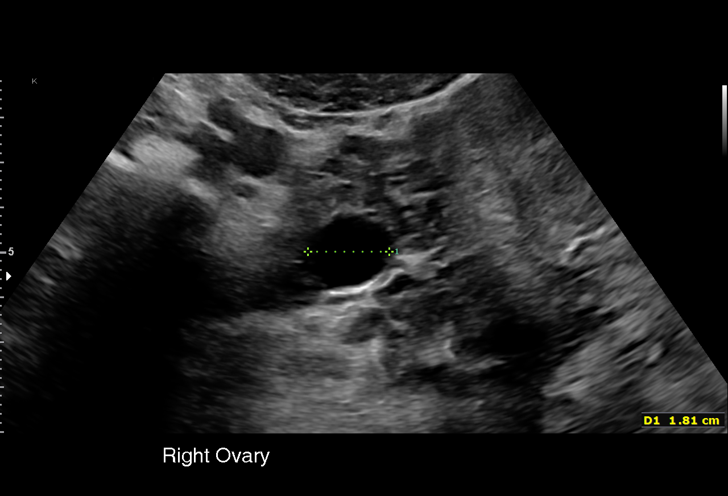
[im 28/62]
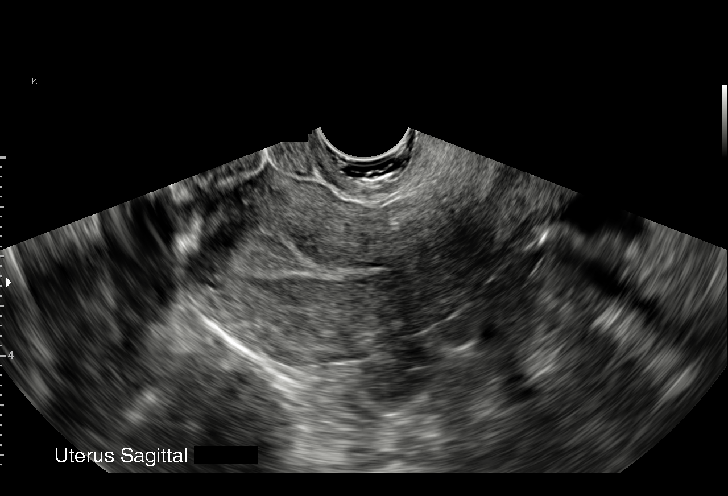
[im 32/62]
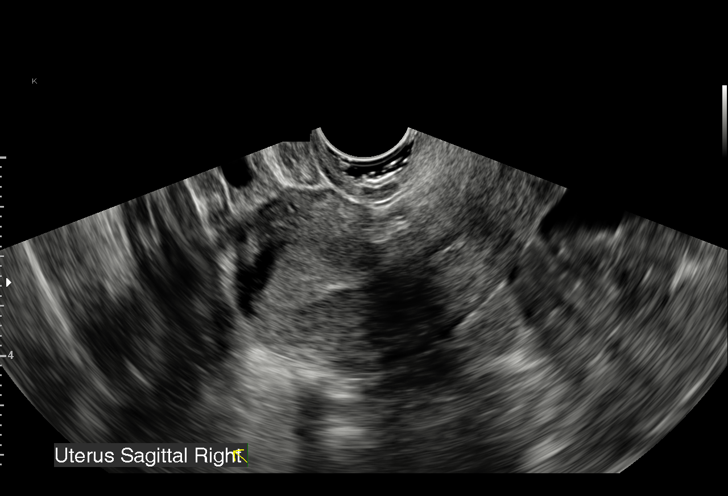
[im 34/62]
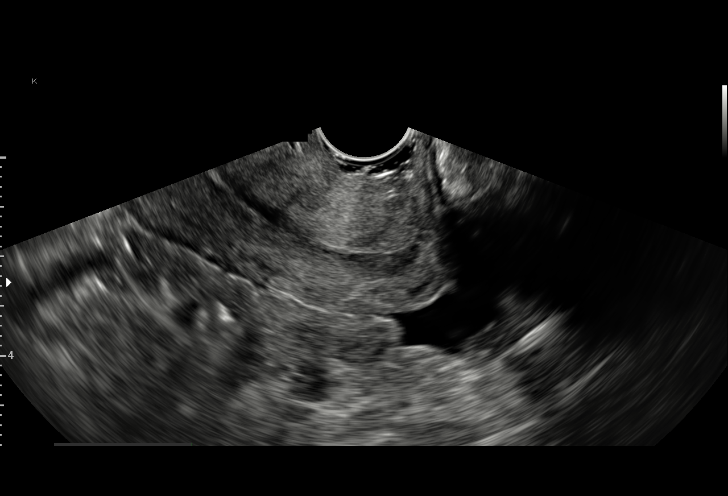
[im 39/62]
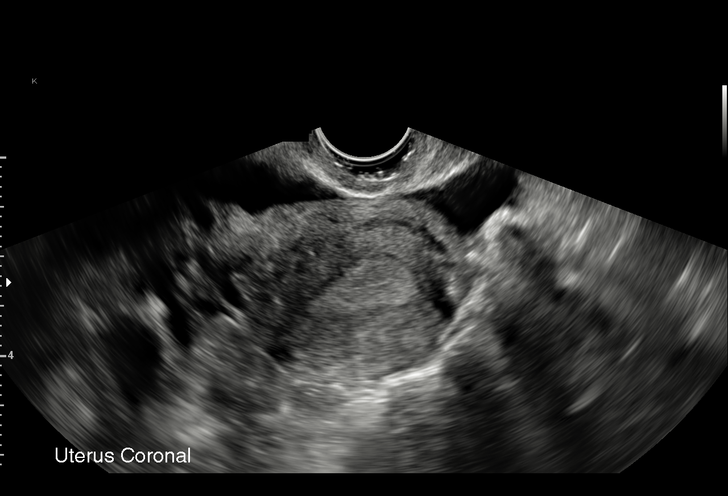
[im 43/62]
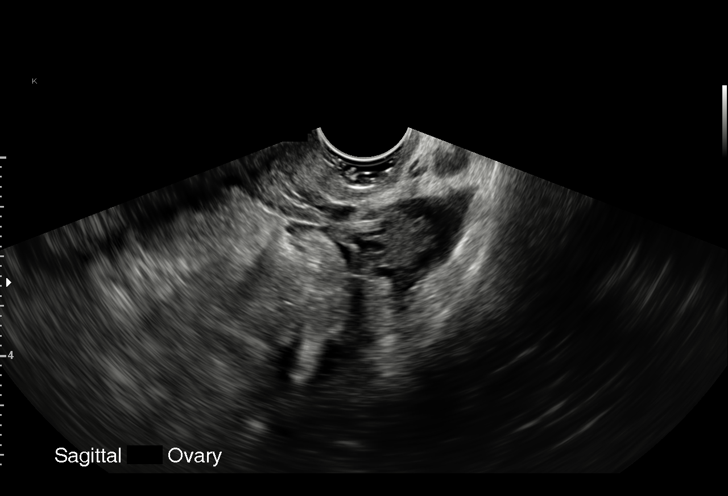
[im 48/62]
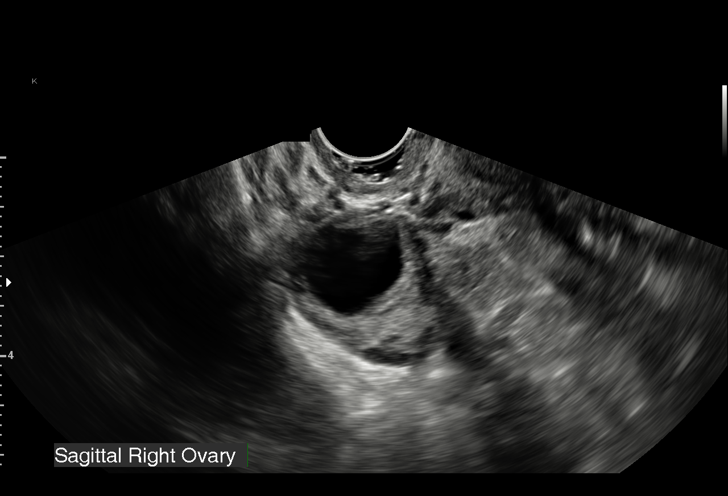
[im 52/62]
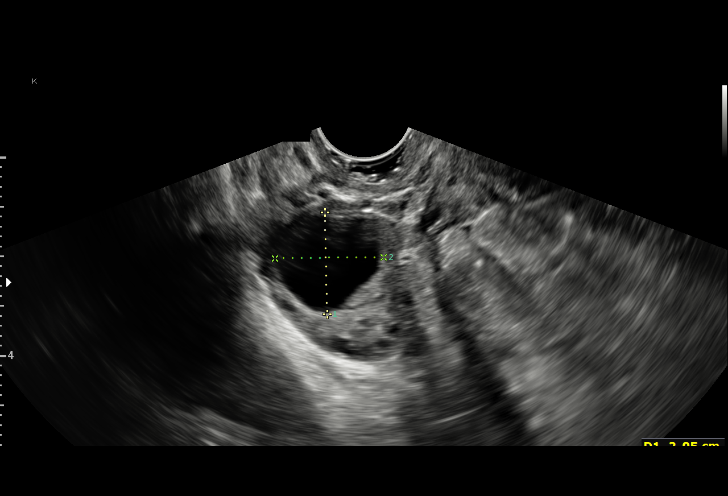
[im 57/62]
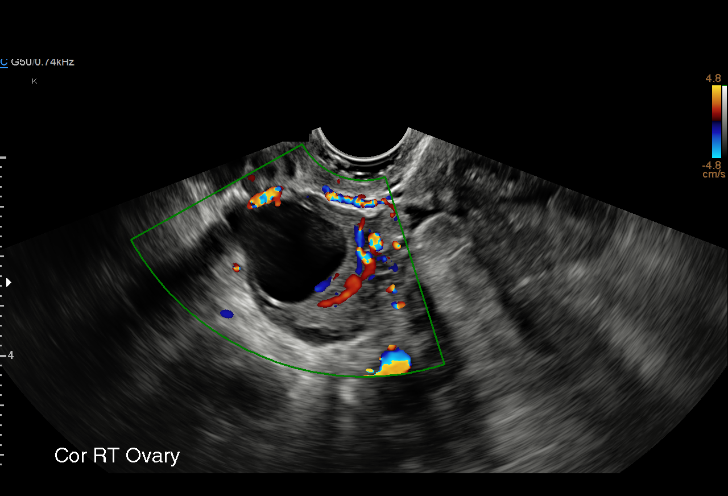
[im 62/62]
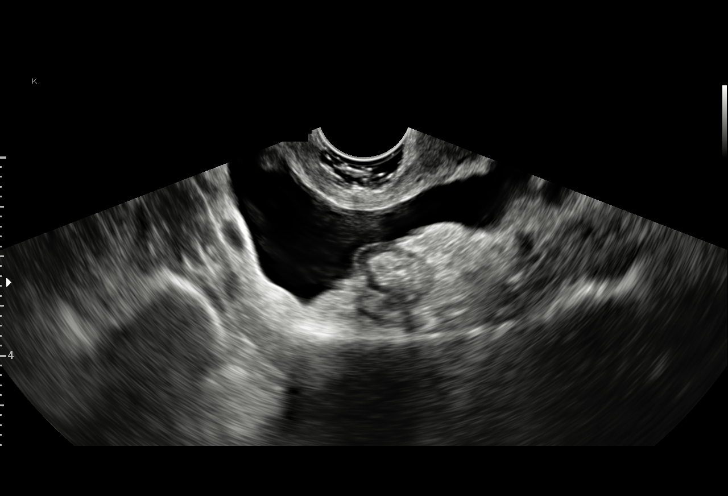

[15 of 28 positions shown; findings below may reference images not displayed]

FINDINGS: No intrauterine pregnancy was identified.

Maternal uterus/adnexae: There is a probable 2.2 cm corpus luteal
cyst involving the right ovary. The left ovary is unremarkable.
There is a moderate amount of free fluid in the patient's pelvis.
IMPRESSION: No IUP is visualized.

By definition, in the setting of a positive pregnancy test, this
reflects a pregnancy of unknown location. Differential
considerations include early normal IUP, abnormal IUP/missed
abortion, or nonvisualized ectopic pregnancy. Serial beta HCG is
suggested. Consider repeat pelvic ultrasound in 14 days.

There is a moderate volume of pelvic free fluid.

## 2021-07-21 NOTE — L&D Delivery Note (Signed)
Delivery Note 21 y.o. [id number] at [redacted]w[redacted]d admitted in latent labor At 5:22 AM a viable female was delivered via Vaginal, Spontaneous (Presentation: Middle Occiput Posterior).  APGAR: 9, 9; weight  TBD   Placenta status: Spontaneous, Intact.  Cord: 3 vessels with the following complications: None.  Cord pH: not collected.  Anesthesia: Epidural Episiotomy: None Lacerations: None Suture Repair:  none Est. Blood Loss (mL): 126  Mom to postpartum.  Baby to Couplet care / Skin to Skin.  Sheppard Evens MD MPH OB Fellow, Faculty Practice [facility], [facility] for [facility] 07/05/2022

## 2021-11-01 ENCOUNTER — Ambulatory Visit (HOSPITAL_COMMUNITY)
Admission: EM | Admit: 2021-11-01 | Discharge: 2021-11-01 | Disposition: A | Discharge status: Home / Self Care | Payer: Medicaid Other | Attending: Nurse Practitioner | Admitting: Nurse Practitioner

## 2021-11-01 ENCOUNTER — Encounter (HOSPITAL_COMMUNITY): Payer: Self-pay | Admitting: Emergency Medicine

## 2021-11-01 ENCOUNTER — Other Ambulatory Visit (HOSPITAL_COMMUNITY): Payer: Self-pay

## 2021-11-01 ENCOUNTER — Other Ambulatory Visit: Payer: Self-pay

## 2021-11-01 DIAGNOSIS — Z113 Encounter for screening for infections with a predominantly sexual mode of transmission: Secondary | ICD-10-CM | POA: Diagnosis not present

## 2021-11-01 DIAGNOSIS — N898 Other specified noninflammatory disorders of vagina: Secondary | ICD-10-CM | POA: Diagnosis present

## 2021-11-01 DIAGNOSIS — Z3490 Encounter for supervision of normal pregnancy, unspecified, unspecified trimester: Secondary | ICD-10-CM

## 2021-11-01 DIAGNOSIS — J309 Allergic rhinitis, unspecified: Secondary | ICD-10-CM | POA: Diagnosis not present

## 2021-11-01 DIAGNOSIS — Z3201 Encounter for pregnancy test, result positive: Secondary | ICD-10-CM | POA: Diagnosis not present

## 2021-11-01 LAB — POCT URINALYSIS DIPSTICK, ED / UC
Bilirubin Urine: NEGATIVE
Glucose, UA: NEGATIVE mg/dL
Hgb urine dipstick: NEGATIVE
Ketones, ur: NEGATIVE mg/dL
Leukocytes,Ua: NEGATIVE
Nitrite: NEGATIVE
Protein, ur: NEGATIVE mg/dL
Specific Gravity, Urine: >=1.03 (ref 1.005–1.030)
Urobilinogen, UA: 0.2 mg/dL (ref 0.0–1.0)
pH: 5.5 (ref 5.0–8.0)

## 2021-11-01 LAB — POC URINE PREG, ED: Preg Test, Ur: POSITIVE — AB

## 2021-11-01 MED ORDER — CETIRIZINE HCL 10 MG PO TABS
10.0000 mg | ORAL_TABLET | Freq: Every day | ORAL | 1 refills | Status: DC
  Filled 2021-11-01: qty 30, 30d supply, fill #0

## 2021-11-01 MED ORDER — FLUTICASONE PROPIONATE 50 MCG/ACT NA SUSP
2.0000 | Freq: Every day | NASAL | 0 refills | Status: DC
  Filled 2021-11-01: qty 16, 30d supply, fill #0

## 2021-11-01 MED ORDER — METRONIDAZOLE 0.75 % VA GEL
1.0000 | Freq: Two times a day (BID) | VAGINAL | 0 refills | Status: DC
  Filled 2021-11-01: qty 70, 4d supply, fill #0

## 2021-11-01 MED ORDER — MONTELUKAST SODIUM 10 MG PO TABS
10.0000 mg | ORAL_TABLET | Freq: Every day | ORAL | 1 refills | Status: DC
  Filled 2021-11-01: qty 30, 30d supply, fill #0

## 2021-11-01 MED ORDER — METRONIDAZOLE 0.75 % VA GEL
1.0000 | Freq: Every day | VAGINAL | 0 refills | Status: DC
  Filled 2021-11-01: qty 70, 5d supply, fill #0

## 2021-11-01 MED ORDER — CETIRIZINE HCL 10 MG PO TABS
10.0000 mg | ORAL_TABLET | Freq: Every day | ORAL | 1 refills | Status: AC
  Filled 2021-11-01: qty 30, 30d supply, fill #0

## 2021-11-01 NOTE — ED Triage Notes (Addendum)
Pt reports vaginal discomfort, white vaginal discharge and vaginal odor x 3 days.  ?Pt also requesting medication for allergies . ?

## 2021-11-01 NOTE — ED Provider Notes (Signed)
?[facility] ? ? ? ?CSN: [id number] ?Arrival date & time: 11/01/21  8469 ? ? ?  ? ?History   ?Chief Complaint ?Chief Complaint  ?Patient presents with  ? Exposure to STD  ?  Entered by patient  ? Nasal Congestion  ? ? ?HPI ?Samantha Peters is a 21 y.o. female.  ? ?The patient is a 21 year old female who presents for vaginal discharge and odor.  She states that she has noticed the symptoms over the past 3 days.  She denies vaginal itching, urinary frequency, urgency, hesitancy, abdominal pain, or pelvic pain.  Her last menstrual period was on 10/02/2021.  She has had 1 female partner in the past 90 days with approximately 5% condom use. ? ?She also reports for complaints of allergic rhinitis.  Patient states she has a long history of allergies.  States she was at one time receiving allergy injections.  Today, she complains of nasal congestion, postnasal drainage, cough, and intermittent sore throat.  She denies fever, chills, or GI symptoms.  She states that she was taking Claritin for her symptoms, but that it was not working well.  It has been quite sometime since she received an allergy injection. ? ?The history is provided by the patient.  ? ?Past Medical History:  ?Diagnosis Date  ? Elective abortion   ? Medical history non-contributory   ? ? ?Patient Active Problem List  ? Diagnosis Date Noted  ? Pregnancy of unknown anatomic location 12/30/2019  ? ? ?Past Surgical History:  ?Procedure Laterality Date  ? HERNIA REPAIR    ? ? ?OB History   ? ? Gravida  ?3  ? Para  ?2  ? Term  ?1  ? Preterm  ?1  ? AB  ?   ? Living  ?2  ?  ? ? SAB  ?   ? IAB  ?   ? Ectopic  ?   ? Multiple  ?   ? Live Births  ?2  ?   ?  ?  ? ? ? ?Home Medications   ? ?Prior to Admission medications   ?Medication Sig Start Date End Date Taking? Authorizing Provider  ?cetirizine (ZYRTEC) 10 MG tablet Take 1 tablet (10 mg total) by mouth daily. 11/01/21  Yes Scarlettrose Costilow-Warren, Sadie Haber, NP  ?fluticasone (FLONASE) 50 MCG/ACT nasal spray Place 2 sprays  into both nostrils daily. 11/01/21  Yes Miarose Lippert-Warren, Sadie Haber, NP  ?metroNIDAZOLE (METROGEL) 0.75 % vaginal gel Place 1 Applicatorful vaginally 2 (two) times daily for 5 days. 11/01/21 11/06/21 Yes Tayveon Lombardo-Warren, Sadie Haber, NP  ?loratadine (CLARITIN) 10 MG tablet Take 1 tablet (10 mg total) by mouth daily. One po daily x 5 days 11/28/19   Pricilla Loveless, MD  ?ondansetron (ZOFRAN ODT) 4 MG disintegrating tablet Take 1 tablet (4 mg total) by mouth every 8 (eight) hours as needed for nausea or vomiting. 11/28/19   Pricilla Loveless, MD  ? ? ?Family History ?Family History  ?Problem Relation Age of Onset  ? Diabetes Mother   ? Hypertension Mother   ? Heart failure Mother   ? Kidney disease Mother   ? ? ?Social History ?Social History  ? ?Tobacco Use  ? Smoking status: Never  ? Smokeless tobacco: Never  ?Vaping Use  ? Vaping Use: Never used  ?Substance Use Topics  ? Alcohol use: No  ? Drug use: No  ? ? ? ?Allergies   ?Patient has no known allergies. ? ? ?Review of Systems ?Review of Systems  ?Constitutional:  Negative.   ?HENT:  Positive for congestion, postnasal drip, rhinorrhea and sore throat.   ?Eyes:  Positive for itching.  ?Respiratory:  Positive for cough.   ?Cardiovascular: Negative.   ?Gastrointestinal: Negative.   ?Genitourinary:  Positive for vaginal discharge. Negative for dysuria, flank pain, pelvic pain, urgency, vaginal bleeding and vaginal pain.  ?Skin: Negative.   ?Psychiatric/Behavioral: Negative.    ? ? ?Physical Exam ?Triage Vital Signs ?ED Triage Vitals  ?Enc Vitals Group  ?   BP 11/01/21 1013 (!) 144/97  ?   Pulse Rate 11/01/21 1013 93  ?   Resp 11/01/21 1013 16  ?   Temp 11/01/21 1013 98.4 ?F (36.9 ?C)  ?   Temp Source 11/01/21 1013 Oral  ?   SpO2 11/01/21 1013 98 %  ?   Weight 11/01/21 1010 145 lb 1 oz (65.8 kg)  ?   Height 11/01/21 1010 5\' 1"  (1.549 m)  ?   Head Circumference --   ?   Peak Flow --   ?   Pain Score 11/01/21 1010 0  ?   Pain Loc --   ?   Pain Edu? --   ?   Excl. in GC? --   ? ?No data  found. ? ?Updated Vital Signs ?BP (!) 144/97   Pulse 93   Temp 98.4 ?F (36.9 ?C) (Oral)   Resp 16   Ht 5\' 1"  (1.549 m)   Wt 145 lb 1 oz (65.8 kg)   LMP 10/02/2021 (Exact Date)   SpO2 98%   BMI 27.41 kg/m?  ? ?Visual Acuity ?Right Eye Distance:   ?Left Eye Distance:   ?Bilateral Distance:   ? ?Right Eye Near:   ?Left Eye Near:    ?Bilateral Near:    ? ?Physical Exam ?Vitals reviewed.  ?Constitutional:   ?   General: She is not in acute distress. ?   Appearance: Normal appearance.  ?HENT:  ?   Head: Normocephalic and atraumatic.  ?   Right Ear: Tympanic membrane, ear canal and external ear normal.  ?   Left Ear: Tympanic membrane, ear canal and external ear normal.  ?   Nose: Congestion and rhinorrhea present.  ?   Mouth/Throat:  ?   Mouth: Mucous membranes are moist.  ?   Pharynx: Posterior oropharyngeal erythema present. No oropharyngeal exudate.  ?Eyes:  ?   Extraocular Movements: Extraocular movements intact.  ?   Conjunctiva/sclera: Conjunctivae normal.  ?   Pupils: Pupils are equal, round, and reactive to light.  ?Cardiovascular:  ?   Rate and Rhythm: Normal rate and regular rhythm.  ?   Pulses: Normal pulses.  ?   Heart sounds: Normal heart sounds.  ?Pulmonary:  ?   Effort: Pulmonary effort is normal. No respiratory distress.  ?   Breath sounds: Normal breath sounds. No wheezing or rales.  ?Abdominal:  ?   General: Bowel sounds are normal.  ?   Tenderness: There is no abdominal tenderness.  ?Musculoskeletal:  ?   Cervical back: Normal range of motion and neck supple. No tenderness.  ?Skin: ?   General: Skin is warm and dry.  ?Neurological:  ?   General: No focal deficit present.  ?   Mental Status: She is alert and oriented to person, place, and time.  ?Psychiatric:     ?   Mood and Affect: Mood normal.     ?   Behavior: Behavior normal.  ? ? ? ?UC Treatments / Results  ?Labs ?(  all labs ordered are listed, but only abnormal results are displayed) ?Labs Reviewed  ?POC URINE PREG, ED - Abnormal; Notable  for the following components:  ?    Result Value  ? Preg Test, Ur POSITIVE (*)   ? All other components within normal limits  ?POCT URINALYSIS DIPSTICK, ED / UC  ?CERVICOVAGINAL ANCILLARY ONLY  ? ? ?EKG ? ? ?Radiology ?No results found. ? ?Procedures ?Procedures (including critical care time) ? ?Medications Ordered in UC ?Medications - No data to display ? ?Initial Impression / Assessment and Plan / UC Course  ?I have reviewed the triage vital signs and the nursing notes. ? ?Pertinent labs & imaging results that were available during my care of the patient were reviewed by me and considered in my medical decision making (see chart for details). ? ?The patient is a 21 year old female who presents for STI testing and allergy symptoms.  She complains of vaginal discharge and vaginal odor.  Patient states the vaginal discharge is malodorous.  She is also requesting STI testing.  Patient advised that we can start her on something for BV today, but if her cytology results are negative, she will be contacted and asked to stop the medication.  Her urine pregnancy was also positive today.  Patient was surprised regarding this result.  Her urine was negative for any signs signs of STI.  Patient advised to increase her condom use and consider birth control.  Also medications were prescribed that were checked regarding the pregnancy before prescribing for her allergies. Patient advised to follow-up as needed. ?Final Clinical Impressions(s) / UC Diagnoses  ? ?Final diagnoses:  ?Screening examination for sexually transmitted disease  ?Allergic rhinitis, unspecified seasonality, unspecified trigger  ?Unplanned pregnancy  ? ? ? ?Discharge Instructions   ? ?  ?Take medication as prescribed. ?Your urine pregnancy test was positive today.  You will be contacted if your cytology results are positive and provided treatment. ?You will also be started on medication for BV.  Again, if your cytology results are negative, you will be  contacted and asked to stop the medication. ?Increase her condom use, and consider birth control. ?Follow-up as needed. ? ? ? ? ?ED Prescriptions   ? ? Medication Sig Dispense Auth. Provider  ? cetirizine (ZYRTEC) 1

## 2021-11-01 NOTE — Discharge Instructions (Addendum)
Take medication as prescribed. ?Your urine pregnancy test was positive today.  You will be contacted if your cytology results are positive and provided treatment. ?You will also be started on medication for BV.  Again, if your cytology results are negative, you will be contacted and asked to stop the medication. ?Increase her condom use, and consider birth control. ?Follow-up as needed. ?

## 2021-11-04 LAB — CERVICOVAGINAL ANCILLARY ONLY
Bacterial Vaginitis (gardnerella): POSITIVE — AB
Candida Glabrata: NEGATIVE
Candida Vaginitis: POSITIVE — AB
Chlamydia: NEGATIVE
Comment: NEGATIVE
Comment: NEGATIVE
Comment: NEGATIVE
Comment: NEGATIVE
Comment: NEGATIVE
Comment: NORMAL
Neisseria Gonorrhea: NEGATIVE
Trichomonas: NEGATIVE

## 2021-11-05 ENCOUNTER — Telehealth (HOSPITAL_COMMUNITY): Payer: Self-pay | Admitting: Emergency Medicine

## 2021-11-05 ENCOUNTER — Other Ambulatory Visit (HOSPITAL_COMMUNITY): Payer: Self-pay

## 2021-11-05 MED ORDER — CLOTRIMAZOLE 1 % VA CREA
1.0000 | TOPICAL_CREAM | Freq: Every day | VAGINAL | 0 refills | Status: DC
  Filled 2021-11-05: qty 45, fill #0

## 2021-11-05 MED ORDER — METRONIDAZOLE 0.75 % VA GEL
1.0000 | Freq: Every day | VAGINAL | 0 refills | Status: AC
Start: 2021-11-05 — End: 2021-11-12
  Filled 2021-11-05: qty 70, 7d supply, fill #0

## 2021-11-09 ENCOUNTER — Encounter (HOSPITAL_COMMUNITY): Payer: Self-pay | Admitting: Obstetrics & Gynecology

## 2021-11-09 ENCOUNTER — Other Ambulatory Visit: Payer: Self-pay

## 2021-11-09 ENCOUNTER — Inpatient Hospital Stay (HOSPITAL_COMMUNITY)
Admission: AD | Admit: 2021-11-09 | Discharge: 2021-11-09 | Disposition: A | Discharge status: Home / Self Care | Payer: Medicaid Other | Attending: Obstetrics & Gynecology | Admitting: Obstetrics & Gynecology

## 2021-11-09 ENCOUNTER — Inpatient Hospital Stay (HOSPITAL_COMMUNITY): Payer: Medicaid Other

## 2021-11-09 DIAGNOSIS — Z3A01 Less than 8 weeks gestation of pregnancy: Secondary | ICD-10-CM | POA: Diagnosis not present

## 2021-11-09 DIAGNOSIS — O209 Hemorrhage in early pregnancy, unspecified: Secondary | ICD-10-CM | POA: Diagnosis present

## 2021-11-09 DIAGNOSIS — Y939 Activity, unspecified: Secondary | ICD-10-CM | POA: Insufficient documentation

## 2021-11-09 DIAGNOSIS — W1830XA Fall on same level, unspecified, initial encounter: Secondary | ICD-10-CM | POA: Insufficient documentation

## 2021-11-09 DIAGNOSIS — Z3490 Encounter for supervision of normal pregnancy, unspecified, unspecified trimester: Secondary | ICD-10-CM | POA: Diagnosis not present

## 2021-11-09 DIAGNOSIS — Z6790 Unspecified blood type, Rh positive: Secondary | ICD-10-CM

## 2021-11-09 LAB — URINALYSIS, ROUTINE W REFLEX MICROSCOPIC
Bilirubin Urine: NEGATIVE
Glucose, UA: NEGATIVE mg/dL
Hgb urine dipstick: NEGATIVE
Ketones, ur: NEGATIVE mg/dL
Leukocytes,Ua: NEGATIVE
Nitrite: NEGATIVE
Protein, ur: NEGATIVE mg/dL
Specific Gravity, Urine: 1.025 (ref 1.005–1.030)
pH: 5 (ref 5.0–8.0)

## 2021-11-09 LAB — CBC WITH DIFFERENTIAL/PLATELET
Abs Immature Granulocytes: 0.06 10*3/uL (ref 0.00–0.07)
Basophils Absolute: 0 10*3/uL (ref 0.0–0.1)
Basophils Relative: 0 %
Eosinophils Absolute: 0.6 10*3/uL — ABNORMAL HIGH (ref 0.0–0.5)
Eosinophils Relative: 5 %
HCT: 35.2 % — ABNORMAL LOW (ref 36.0–46.0)
Hemoglobin: 11.7 g/dL — ABNORMAL LOW (ref 12.0–15.0)
Immature Granulocytes: 1 %
Lymphocytes Relative: 9 %
Lymphs Abs: 1.1 10*3/uL (ref 0.7–4.0)
MCH: 27.7 pg (ref 26.0–34.0)
MCHC: 33.2 g/dL (ref 30.0–36.0)
MCV: 83.2 fL (ref 80.0–100.0)
Monocytes Absolute: 0.7 10*3/uL (ref 0.1–1.0)
Monocytes Relative: 6 %
Neutro Abs: 10.3 10*3/uL — ABNORMAL HIGH (ref 1.7–7.7)
Neutrophils Relative %: 79 %
Platelets: 297 10*3/uL (ref 150–400)
RBC: 4.23 MIL/uL (ref 3.87–5.11)
RDW: 13.2 % (ref 11.5–15.5)
WBC: 12.9 10*3/uL — ABNORMAL HIGH (ref 4.0–10.5)
nRBC: 0 % (ref 0.0–0.2)

## 2021-11-09 LAB — COMPREHENSIVE METABOLIC PANEL
ALT: 16 U/L (ref 0–44)
AST: 17 U/L (ref 15–41)
Albumin: 3.3 g/dL — ABNORMAL LOW (ref 3.5–5.0)
Alkaline Phosphatase: 43 U/L (ref 38–126)
Anion gap: 8 (ref 5–15)
BUN: 9 mg/dL (ref 6–20)
CO2: 23 mmol/L (ref 22–32)
Calcium: 8.4 mg/dL — ABNORMAL LOW (ref 8.9–10.3)
Chloride: 103 mmol/L (ref 98–111)
Creatinine, Ser: 0.76 mg/dL (ref 0.44–1.00)
GFR, Estimated: >60 mL/min (ref >60)
Glucose, Bld: 85 mg/dL (ref 70–99)
Potassium: 3.6 mmol/L (ref 3.5–5.1)
Sodium: 134 mmol/L — ABNORMAL LOW (ref 135–145)
Total Bilirubin: 0.7 mg/dL (ref 0.3–1.2)
Total Protein: 6.1 g/dL — ABNORMAL LOW (ref 6.5–8.1)

## 2021-11-09 LAB — HCG, QUANTITATIVE, PREGNANCY: hCG, Beta Chain, Quant, S: 7689 m[IU]/mL — ABNORMAL HIGH (ref <5)

## 2021-11-09 LAB — WET PREP, GENITAL
Clue Cells Wet Prep HPF POC: NONE SEEN
Sperm: NONE SEEN
Trich, Wet Prep: NONE SEEN
WBC, Wet Prep HPF POC: >=10 — AB (ref <10)
Yeast Wet Prep HPF POC: NONE SEEN

## 2021-11-09 MED ORDER — ACETAMINOPHEN 500 MG PO TABS
1000.0000 mg | ORAL_TABLET | Freq: Once | ORAL | Status: AC
Start: 2021-11-09 — End: 2021-11-09
  Administered 2021-11-09: 1000 mg via ORAL
  Filled 2021-11-09: qty 2

## 2021-11-09 MED ORDER — CYCLOBENZAPRINE HCL 5 MG PO TABS: 10.0000 mg | ORAL_TABLET | Freq: Once | ORAL | Status: DC

## 2021-11-09 NOTE — ED Provider Triage Note (Signed)
Emergency Medicine Provider OB Triage Evaluation Note ? ?Samantha Peters is a 21 y.o. female, [id number], at Unknown gestation who presents to the emergency department with complaints of fall.  States she missed a step and fell onto left side/abdomen.  Reports she started having vaginal bleeding earlier this AM and wanted to get baby checked out.    Has had a + UPT already, not on prenatals.  No other injuries reported ? ?Review of  Systems  ?Positive: fall, vaginal bleeding ?Negative: fever ? ?Physical Exam  ?BP 125/77   Pulse (!) 103   Temp 98.4 ?F (36.9 ?C) (Oral)   Resp 16   SpO2 99%  ?General: Awake, no distress  ?HEENT: Atraumatic  ?Resp: Normal effort  ?Cardiac: Normal rate ?Abd: Nondistended, nontender  ?MSK: Moves all extremities without difficulty ?Neuro: Speech clear ? ?Medical Decision Making  ?Pt evaluated for pregnancy concern and is stable for transfer to MAU. Pt is in agreement with plan for transfer. ? ?6:31 AM Discussed with MAU RN answering for APP as she is with patient-- ok to send to MAU. ? ?Clinical Impression  ?No diagnosis found. ? ? ?  ?Garlon Hatchet, PA-C ?11/09/21 [phone] ? ?

## 2021-11-09 NOTE — MAU Note (Signed)
PT SAYS ON Friday AT 7PM- FELL DOWN 7 STEPS- LANDED ON LEFT SIDE ?AT 0100- B-ROOM-  RED VB WHEN SHE WIPED ?IN TRIAGE - NO VB ?FEELS LOWER ABD CRAMPS  ?NO APPOINTMENT YET ? ?

## 2021-11-09 NOTE — Discharge Instructions (Signed)
Prenatal Care Providers ? ?         ?[facility] - accepts patients without insurance ? Phone: [phone] ? ?[facility]  ? Phone: [phone] ? ?[facility]      ? Phone: [phone]   ?         ?[facility] @ [place]    ? Phone: [phone] ?         ?[facility] [place]  ? Phone: [phone] ? ?[facility] - accepts patients without insurance ? Phone: [phone] ? ?[facility] ?Phone: [phone] ?    ?[facility] - accepts patients without insurance ?Phone: [phone] ? ?[facility]  ?Phone: [phone] ? ?[facility] ?Phone: [phone] ? ?[facility] ?Phone: [phone] ? ?[facility] ?Phone: [phone] ? ?[facility] ?Phone: [phone] ? ?[facility]  ?Phone: [phone] ? ? ? ? ? ? ?                 Safe Medications in Pregnancy  ? ? ?Acne: ?Benzoyl Peroxide ?Salicylic Acid ? ?Backache/Headache: ?Tylenol: 2 regular strength every 4 hours OR ?             2 Extra strength every 6 hours ? ?Colds/Coughs/Allergies: ?Benadryl (alcohol free) 25 mg every 6 hours as needed ?Breath right strips ?Claritin ?Cepacol throat lozenges ?Chloraseptic throat spray ?Cold-Eeze- up to three times per day ?Cough drops, alcohol free ?Flonase (by prescription only) ?Guaifenesin ?Mucinex ?Robitussin DM (plain only, alcohol free) ?Saline nasal spray/drops ?Sudafed (pseudoephedrine) & Actifed ** use only after [redacted] weeks gestation and if you do not have high blood pressure ?Tylenol ?Vicks Vaporub ?Zinc lozenges ?Zyrtec  ? ?Constipation: ?Colace ?Ducolax suppositories ?Fleet enema ?Glycerin suppositories ?Metamucil ?Milk of magnesia ?Miralax ?Senokot ?Smooth move tea ? ?Diarrhea: ?Kaopectate ?Imodium A-D ? ?*NO pepto  Bismol ? ?Hemorrhoids: ?Anusol ?Anusol HC ?Preparation H ?Tucks ? ?Indigestion: ?Tums ?Maalox ?Mylanta ?Zantac  ?Pepcid ? ?Insomnia: ?Benadryl (alcohol free) 25mg every 6 hours as needed ?Tylenol PM ?Unisom, no Gelcaps ? ?Leg Cramps: ?Tums ?MagGel ? ?Nausea/Vomiting:  ?Bonine ?Dramamine ?Emetrol ?Ginger extract ?Sea bands ?Meclizine  ?Nausea medication to take during pregnancy:  ?Unisom (doxylamine succinate 25 mg tablets) Take one tablet daily at bedtime. If symptoms are not adequately controlled, the dose can be increased to a maximum recommended dose of two tablets daily (1/2 tablet in the morning, 1/2 tablet mid-afternoon and one at bedtime). ?Vitamin B6 100mg tablets. Take one tablet twice a day (up to 200 mg per day). ? ?Skin Rashes: ?Aveeno products ?Benadryl cream or 25mg every 6 hours as needed ?Calamine Lotion ?1% cortisone cream ? ?Yeast infection: ?Gyne-lotrimin 7 ?Monistat 7 ? ? ?**If taking multiple medications, please check labels to avoid duplicating the same active ingredients ?**take medication as directed on the label ?** Do not exceed 4000 mg of tylenol in 24 hours ?**Do not take medications that contain aspirin or ibuprofen ? ? ? ? ? ? ? ? ? ?

## 2021-11-09 NOTE — ED Notes (Signed)
[facility] is accepting this pt  lisa pa has seen and called [facility] and reported ?

## 2021-11-09 NOTE — MAU Provider Note (Signed)
?History  ?  ? ?CSN: [id number] ? ?Arrival date and time: 11/09/21 [id number] ? ? Event Date/Time  ? First Provider Initiated Contact with Patient 11/09/21 [id number]   ?  ? ?Chief Complaint  ?Patient presents with  ? Fall  ? Vaginal Bleeding  ? ?Samantha Peters is a 21 y.o. [phone] [id number] at 6514w3d who presents to MAU for pregnancy evaluation after a fall last night. Patient was seen an cleared by the ED for the traumatic event. Patient reports one episode of bleeding after wiping when using the restroom early this morning, but denies additional episodes since then. Patient states she is currently experiencing abdominal cramping and soreness from her fall, and states that she was not given anything in the ED for her pain. Patient states she is using a heating pad currently given to her by the RN and it is helping. ? ?Pt denies LOF, ctx, decreased FM, vaginal discharge/odor/itching. ?Pt denies N/V, constipation, diarrhea, or urinary problems. ?Pt denies fever, chills, fatigue, sweating or changes in appetite. ?Pt denies SOB or chest pain. ?Pt denies dizziness, HA, light-headedness, weakness. ? ?Allergies? NKDA ?Current medications/supplements? MetroGel, Zyrtec, Flonase, Claritin ? ? ?OB History   ? ? Gravida  ?4  ? Para  ?2  ? Term  ?1  ? Preterm  ?1  ? AB  ?   ? Living  ?2  ?  ? ? SAB  ?   ? IAB  ?   ? Ectopic  ?   ? Multiple  ?   ? Live Births  ?2  ?   ?  ?  ? ? ?Past Medical History:  ?Diagnosis Date  ? Elective abortion   ? History of umbilical hernia 2007  ? Medical history non-contributory   ? ? ?Past Surgical History:  ?Procedure Laterality Date  ? HERNIA REPAIR    ? ? ?Family History  ?Problem Relation Age of Onset  ? Diabetes Mother   ? Hypertension Mother   ? Heart failure Mother   ? Kidney disease Mother   ? ? ?Social History  ? ?Tobacco Use  ? Smoking status: Former  ?  Types: Cigarettes  ? Smokeless tobacco: Never  ?Vaping Use  ? Vaping Use: Never used  ?Substance Use Topics  ? Alcohol use: No  ? Drug use: No   ? ? ?Allergies: No Known Allergies ? ?Medications Prior to Admission  ?Medication Sig Dispense Refill Last Dose  ? fluticasone (FLONASE) 50 MCG/ACT nasal spray Place 2 sprays into both nostrils daily. 16 g 0 11/08/2021 at 1200  ? metroNIDAZOLE (METROGEL VAGINAL) 0.75 % vaginal gel Place 1 Applicatorful vaginally at bedtime for 5 days. 70 g 0 11/08/2021 at 2100  ? cetirizine (ZYRTEC) 10 MG tablet Take 1 tablet (10 mg total) by mouth daily. 30 tablet 1   ? clotrimazole (GYNE-LOTRIMIN) 1 % vaginal cream Place 1 Applicatorful vaginally at bedtime. 45 g 0   ? loratadine (CLARITIN) 10 MG tablet Take 1 tablet (10 mg total) by mouth daily. One po daily x 5 days 30 tablet 0   ? ondansetron (ZOFRAN ODT) 4 MG disintegrating tablet Take 1 tablet (4 mg total) by mouth every 8 (eight) hours as needed for nausea or vomiting. 10 tablet 0   ? ? ?Review of Systems  ?Constitutional:  Negative for chills, diaphoresis, fatigue and fever.  ?Eyes:  Negative for visual disturbance.  ?Respiratory:  Negative for shortness of breath.   ?Cardiovascular:  Negative for chest pain.  ?Gastrointestinal:  Positive  for abdominal pain. Negative for constipation, diarrhea, nausea and vomiting.  ?Genitourinary:  Positive for vaginal bleeding. Negative for dysuria, flank pain, frequency, pelvic pain, urgency and vaginal discharge.  ?Neurological:  Negative for dizziness, weakness, light-headedness and headaches.  ? ?Physical Exam  ? ?Blood pressure 122/78, pulse 90, temperature 98.9 ?F (37.2 ?C), temperature source Oral, resp. rate 18, height 5\' 1"  (1.549 m), weight 77.7 kg, last menstrual period 10/02/2021, SpO2 99 %, unknown if currently breastfeeding. ? ?Patient Vitals for the past 24 hrs: ? BP Temp Temp src Pulse Resp SpO2 Height Weight  ?11/09/21 0735 122/78 98.9 ?F (37.2 ?C) Oral 90 -- -- -- --  ?11/09/21 0704 117/70 98.2 ?F (36.8 ?C) Oral (!) 107 18 -- 5\' 1"  (1.549 m) 77.7 kg  ?11/09/21 0622 125/77 98.4 ?F (36.9 ?C) Oral (!) 103 16 99 % -- --   ? ?Physical Exam ?Vitals and nursing note reviewed.  ?Constitutional:   ?   General: She is not in acute distress. ?   Appearance: Normal appearance. She is not ill-appearing, toxic-appearing or diaphoretic.  ?Pulmonary:  ?   Effort: Pulmonary effort is normal.  ?Neurological:  ?   Mental Status: She is alert and oriented to person, place, and time.  ?Psychiatric:     ?   Mood and Affect: Mood normal.     ?   Behavior: Behavior normal.     ?   Thought Content: Thought content normal.     ?   Judgment: Judgment normal.  ? ?Results for orders placed or performed during the hospital encounter of 11/09/21 (from the past 24 hour(s))  ?Comprehensive metabolic panel     Status: Abnormal  ? Collection Time: 11/09/21  8:46 AM  ?Result Value Ref Range  ? Sodium 134 (L) 135 - 145 mmol/L  ? Potassium 3.6 3.5 - 5.1 mmol/L  ? Chloride 103 98 - 111 mmol/L  ? CO2 23 22 - 32 mmol/L  ? Glucose, Bld 85 70 - 99 mg/dL  ? BUN 9 6 - 20 mg/dL  ? Creatinine, Ser 0.76 0.44 - 1.00 mg/dL  ? Calcium 8.4 (L) 8.9 - 10.3 mg/dL  ? Total Protein 6.1 (L) 6.5 - 8.1 g/dL  ? Albumin 3.3 (L) 3.5 - 5.0 g/dL  ? AST 17 15 - 41 U/L  ? ALT 16 0 - 44 U/L  ? Alkaline Phosphatase 43 38 - 126 U/L  ? Total Bilirubin 0.7 0.3 - 1.2 mg/dL  ? GFR, Estimated >60 >60 mL/min  ? Anion gap 8 5 - 15  ?CBC with Differential/Platelet     Status: Abnormal  ? Collection Time: 11/09/21  8:46 AM  ?Result Value Ref Range  ? WBC 12.9 (H) 4.0 - 10.5 K/uL  ? RBC 4.23 3.87 - 5.11 MIL/uL  ? Hemoglobin 11.7 (L) 12.0 - 15.0 g/dL  ? HCT 35.2 (L) 36.0 - 46.0 %  ? MCV 83.2 80.0 - 100.0 fL  ? MCH 27.7 26.0 - 34.0 pg  ? MCHC 33.2 30.0 - 36.0 g/dL  ? RDW 13.2 11.5 - 15.5 %  ? Platelets 297 150 - 400 K/uL  ? nRBC 0.0 0.0 - 0.2 %  ? Neutrophils Relative % 79 %  ? Neutro Abs 10.3 (H) 1.7 - 7.7 K/uL  ? Lymphocytes Relative 9 %  ? Lymphs Abs 1.1 0.7 - 4.0 K/uL  ? Monocytes Relative 6 %  ? Monocytes Absolute 0.7 0.1 - 1.0 K/uL  ? Eosinophils Relative 5 %  ? Eosinophils Absolute  0.6 (H) 0.0 - 0.5  K/uL  ? Basophils Relative 0 %  ? Basophils Absolute 0.0 0.0 - 0.1 K/uL  ? Immature Granulocytes 1 %  ? Abs Immature Granulocytes 0.06 0.00 - 0.07 K/uL  ?Wet prep, genital     Status: Abnormal  ? Collection Time: 11/09/21  8:58 AM  ? Specimen: PATH Cytology Cervicovaginal Ancillary Only  ?Result Value Ref Range  ? Yeast Wet Prep HPF POC NONE SEEN NONE SEEN  ? Trich, Wet Prep NONE SEEN NONE SEEN  ? Clue Cells Wet Prep HPF POC NONE SEEN NONE SEEN  ? WBC, Wet Prep HPF POC >=10 (A) <10  ? Sperm NONE SEEN   ? ?No results found. ? ?MAU Course  ?Procedures ? ?MDM ?-r/o ectopic ?-UA: hazy, otherwise WNL ?-CBC: no abnormalities requiring treatment ?-CMP: no abnormalities requiring treatment ?-[place]: single IUP, +yolk sac, [redacted]w[redacted]d, consulted with Dr. Macon Large re: [place] report and OK to call this IUP at this time ?-hCG: 7,689 ?-ABO: B Positive ?-WetPrep: WNL ?-GC/CT collected ?-Tylenol 1000mg  given (pt could not find anyone to drive her home, so Flexril 10mg  was not given) for soreness from fall, pt reports after administration pain down from 8/10 to 4/10 ?-pt discharged to home in stable condition ? ?Orders Placed This Encounter  ?Procedures  ? Wet prep, genital  ?  Standing Status:   Standing  ?  Number of Occurrences:   1  ? OB LESS THAN 14 WEEKS WITH OB TRANSVAGINAL  ?  Standing Status:   Standing  ?  Number of Occurrences:   1  ?  Order Specific Question:   Symptom/Reason for Exam  ?  Answer:   Abdominal pain affecting pregnancy  ? hCG, quantitative, pregnancy  ?  Standing Status:   Standing  ?  Number of Occurrences:   1  ? Comprehensive metabolic panel  ?  Standing Status:   Standing  ?  Number of Occurrences:   1  ? CBC with Differential/Platelet  ?  Standing Status:   Standing  ?  Number of Occurrences:   1  ? Urinalysis, Routine w reflex microscopic Urine, Clean Catch  ?  Standing Status:   Standing  ?  Number of Occurrences:   1  ? ?Meds ordered this encounter  ?Medications  ? acetaminophen (TYLENOL) tablet 1,000  mg  ? cyclobenzaprine (FLEXERIL) tablet 10 mg  ? ?Assessment and Plan  ? ?1. Intrauterine pregnancy   ?2. [redacted] weeks gestation of pregnancy   ?3. Blood type, Rh positive   ? ?Allergies as of 11/09/2021   ?No Known Allergie

## 2021-11-11 LAB — GC/CHLAMYDIA PROBE AMP (~~LOC~~) NOT AT ARMC
Chlamydia: NEGATIVE
Comment: NEGATIVE
Comment: NORMAL
Neisseria Gonorrhea: NEGATIVE

## 2021-11-25 ENCOUNTER — Inpatient Hospital Stay: Admission: RE | Admit: 2021-11-25 | Payer: Medicaid Other | Source: Ambulatory Visit

## 2021-12-11 ENCOUNTER — Other Ambulatory Visit (HOSPITAL_COMMUNITY): Payer: Self-pay | Admitting: Women's Health

## 2021-12-11 ENCOUNTER — Ambulatory Visit
Admission: RE | Admit: 2021-12-11 | Discharge: 2021-12-11 | Disposition: A | Discharge status: Home / Self Care | Payer: Medicaid Other | Source: Ambulatory Visit | Attending: Women's Health | Admitting: Women's Health

## 2021-12-11 DIAGNOSIS — Z3689 Encounter for other specified antenatal screening: Secondary | ICD-10-CM | POA: Diagnosis not present

## 2021-12-11 DIAGNOSIS — Z3A09 9 weeks gestation of pregnancy: Secondary | ICD-10-CM | POA: Insufficient documentation

## 2021-12-11 DIAGNOSIS — Z3A01 Less than 8 weeks gestation of pregnancy: Secondary | ICD-10-CM | POA: Insufficient documentation

## 2021-12-11 DIAGNOSIS — Z3490 Encounter for supervision of normal pregnancy, unspecified, unspecified trimester: Secondary | ICD-10-CM | POA: Insufficient documentation

## 2021-12-17 ENCOUNTER — Inpatient Hospital Stay (HOSPITAL_COMMUNITY): Payer: Medicaid Other

## 2021-12-17 ENCOUNTER — Encounter (HOSPITAL_COMMUNITY): Payer: Self-pay | Admitting: Obstetrics & Gynecology

## 2021-12-17 ENCOUNTER — Inpatient Hospital Stay (HOSPITAL_COMMUNITY)
Admission: AD | Admit: 2021-12-17 | Discharge: 2021-12-17 | Disposition: A | Discharge status: Home / Self Care | Payer: Medicaid Other | Attending: Obstetrics & Gynecology | Admitting: Obstetrics & Gynecology

## 2021-12-17 ENCOUNTER — Other Ambulatory Visit: Payer: Self-pay

## 2021-12-17 DIAGNOSIS — Z3A10 10 weeks gestation of pregnancy: Secondary | ICD-10-CM

## 2021-12-17 DIAGNOSIS — O26891 Other specified pregnancy related conditions, first trimester: Secondary | ICD-10-CM | POA: Diagnosis present

## 2021-12-17 DIAGNOSIS — R1010 Upper abdominal pain, unspecified: Secondary | ICD-10-CM | POA: Diagnosis not present

## 2021-12-17 HISTORY — DX: Bronchitis, not specified as acute or chronic: J40

## 2021-12-17 LAB — CBC WITH DIFFERENTIAL/PLATELET
Abs Immature Granulocytes: 0.05 10*3/uL (ref 0.00–0.07)
Basophils Absolute: 0 10*3/uL (ref 0.0–0.1)
Basophils Relative: 0 %
Eosinophils Absolute: 0.3 10*3/uL (ref 0.0–0.5)
Eosinophils Relative: 2 %
HCT: 37.4 % (ref 36.0–46.0)
Hemoglobin: 12.6 g/dL (ref 12.0–15.0)
Immature Granulocytes: 0 %
Lymphocytes Relative: 12 %
Lymphs Abs: 1.8 10*3/uL (ref 0.7–4.0)
MCH: 27.6 pg (ref 26.0–34.0)
MCHC: 33.7 g/dL (ref 30.0–36.0)
MCV: 82 fL (ref 80.0–100.0)
Monocytes Absolute: 0.7 10*3/uL (ref 0.1–1.0)
Monocytes Relative: 4 %
Neutro Abs: 12.2 10*3/uL — ABNORMAL HIGH (ref 1.7–7.7)
Neutrophils Relative %: 82 %
Platelets: 301 10*3/uL (ref 150–400)
RBC: 4.56 MIL/uL (ref 3.87–5.11)
RDW: 12.7 % (ref 11.5–15.5)
WBC: 15 10*3/uL — ABNORMAL HIGH (ref 4.0–10.5)
nRBC: 0 % (ref 0.0–0.2)

## 2021-12-17 LAB — URINALYSIS, ROUTINE W REFLEX MICROSCOPIC
Bilirubin Urine: NEGATIVE
Glucose, UA: NEGATIVE mg/dL
Hgb urine dipstick: NEGATIVE
Ketones, ur: 20 mg/dL — AB
Nitrite: NEGATIVE
Protein, ur: NEGATIVE mg/dL
Specific Gravity, Urine: 1.03 (ref 1.005–1.030)
pH: 6 (ref 5.0–8.0)

## 2021-12-17 LAB — COMPREHENSIVE METABOLIC PANEL
ALT: 13 U/L (ref 0–44)
AST: 15 U/L (ref 15–41)
Albumin: 3.3 g/dL — ABNORMAL LOW (ref 3.5–5.0)
Alkaline Phosphatase: 45 U/L (ref 38–126)
Anion gap: 8 (ref 5–15)
BUN: 7 mg/dL (ref 6–20)
CO2: 24 mmol/L (ref 22–32)
Calcium: 9.4 mg/dL (ref 8.9–10.3)
Chloride: 102 mmol/L (ref 98–111)
Creatinine, Ser: 0.59 mg/dL (ref 0.44–1.00)
GFR, Estimated: >60 mL/min (ref >60)
Glucose, Bld: 88 mg/dL (ref 70–99)
Potassium: 3.6 mmol/L (ref 3.5–5.1)
Sodium: 134 mmol/L — ABNORMAL LOW (ref 135–145)
Total Bilirubin: 0.5 mg/dL (ref 0.3–1.2)
Total Protein: 7 g/dL (ref 6.5–8.1)

## 2021-12-17 LAB — LIPASE, BLOOD: Lipase: 24 U/L (ref 11–51)

## 2021-12-17 LAB — AMYLASE: Amylase: 57 U/L (ref 28–100)

## 2021-12-17 MED ORDER — LIDOCAINE VISCOUS HCL 2 % MT SOLN
15.0000 mL | Freq: Once | OROMUCOSAL | Status: AC
  Administered 2021-12-17: 15 mL via ORAL
  Filled 2021-12-17: qty 15

## 2021-12-17 MED ORDER — HYDROMORPHONE HCL 1 MG/ML IJ SOLN
1.0000 mg | Freq: Once | INTRAMUSCULAR | Status: AC
  Administered 2021-12-17: 1 mg via INTRAVENOUS
  Filled 2021-12-17: qty 1

## 2021-12-17 MED ORDER — HYDROMORPHONE HCL 1 MG/ML IJ SOLN
1.0000 mg | Freq: Once | INTRAMUSCULAR | Status: AC
  Administered 2021-12-17: 1 mg via INTRAMUSCULAR
  Filled 2021-12-17: qty 1

## 2021-12-17 MED ORDER — HYDROMORPHONE HCL 1 MG/ML IJ SOLN: 1.0000 mg | Freq: Once | INTRAMUSCULAR | Status: DC

## 2021-12-17 MED ORDER — FAMOTIDINE 20 MG PO TABS
20.0000 mg | ORAL_TABLET | Freq: Two times a day (BID) | ORAL | 2 refills | Status: DC
  Filled 2021-12-17: qty 60, 30d supply, fill #0

## 2021-12-17 MED ORDER — LACTATED RINGERS IV BOLUS
1000.0000 mL | Freq: Once | INTRAVENOUS | Status: AC
  Administered 2021-12-17: 1000 mL via INTRAVENOUS

## 2021-12-17 MED ORDER — ALUM & MAG HYDROXIDE-SIMETH 200-200-20 MG/5ML PO SUSP
30.0000 mL | Freq: Once | ORAL | Status: AC
  Administered 2021-12-17: 30 mL via ORAL
  Filled 2021-12-17: qty 30

## 2021-12-17 NOTE — MAU Note (Signed)
Samantha Peters is a 21 y.o. at [redacted]w[redacted]d here in MAU reporting: constant pain located in right upper abdomen under rib cage.  States it feels like she's constantly being stabbed.  Denies VB.  Reports took Tylenol yesterday, no relief noted.  Onset of complaint: 3 days Pain score: 9/10 There were no vitals filed for this visit.   FHT:165 bpm Lab orders placed from triage:   UA

## 2021-12-17 NOTE — MAU Provider Note (Signed)
History     CSN: [id number]  Arrival date and time: 12/17/21 1429   Event Date/Time   First Provider Initiated Contact with Patient 12/17/21 1525      Chief Complaint  Patient presents with   Abdominal Pain   HPI  Samantha Peters is a 21 y.o. [id number] at [redacted]w[redacted]d who presents for evaluation of upper abdominal pain. Patient reports the pain is all along the right upper side of her abdomen. She reports it radiates to the sternum area.  Patient rates the pain as a 8/10 and has tried tylenol for the pain with no relief. She denies any vaginal bleeding, discharge, and leaking of fluid. Denies any constipation, diarrhea or any urinary complaints.  She denies ever feeling this pain before. Per chart review, she was seen in 5/21 for the same pain in the ER with normal ultrasound.   OB History     Gravida  4   Para  2   Term  1   Preterm  1   AB      Living  2      SAB      IAB      Ectopic      Multiple      Live Births  2           Past Medical History:  Diagnosis Date   Bronchitis    Elective abortion    History of umbilical hernia 2007   Medical history non-contributory     Past Surgical History:  Procedure Laterality Date   HERNIA REPAIR      Family History  Problem Relation Age of Onset   Diabetes Mother    Hypertension Mother    Heart failure Mother    Kidney disease Mother     Social History   Tobacco Use   Smoking status: Former    Types: Cigarettes   Smokeless tobacco: Never  Vaping Use   Vaping Use: Never used  Substance Use Topics   Alcohol use: No   Drug use: No    Allergies: No Known Allergies  No medications prior to admission.    Review of Systems  Constitutional: Negative.  Negative for fatigue and fever.  HENT: Negative.    Respiratory: Negative.  Negative for shortness of breath.   Cardiovascular: Negative.  Negative for chest pain.  Gastrointestinal:  Positive for abdominal pain. Negative for constipation,  diarrhea, nausea and vomiting.  Genitourinary: Negative.  Negative for dysuria, vaginal bleeding and vaginal discharge.  Neurological: Negative.  Negative for dizziness and headaches.  Physical Exam   Blood pressure 124/71, pulse 85, temperature 98.4 F (36.9 C), temperature source Oral, resp. rate 18, height 5\' 1"  (1.549 m), weight 75.2 kg, last menstrual period 10/02/2021, SpO2 100 %, unknown if currently breastfeeding.  Patient Vitals for the past 24 hrs:  BP Temp Temp src Pulse Resp SpO2 Height Weight  12/17/21 2031 124/71 -- -- 85 18 100 % -- --  12/17/21 1519 120/71 -- -- 91 19 -- -- --  12/17/21 1452 127/77 98.4 F (36.9 C) Oral 93 19 100 % -- --  12/17/21 1447 -- -- -- -- -- -- 5\' 1"  (1.549 m) 75.2 kg    Physical Exam Vitals and nursing note reviewed.  Constitutional:      General: She is not in acute distress.    Appearance: She is well-developed.  HENT:     Head: Normocephalic.  Eyes:     Pupils: Pupils are equal, round,  and reactive to light.  Cardiovascular:     Rate and Rhythm: Normal rate and regular rhythm.     Heart sounds: Normal heart sounds.  Pulmonary:     Effort: Pulmonary effort is normal. No respiratory distress.     Breath sounds: Normal breath sounds.  Abdominal:     General: Bowel sounds are normal. There is no distension.     Palpations: Abdomen is soft.     Tenderness: There is abdominal tenderness in the right upper quadrant and epigastric area.  Skin:    General: Skin is warm and dry.  Neurological:     Mental Status: She is alert and oriented to person, place, and time.  Psychiatric:        Mood and Affect: Mood normal.        Behavior: Behavior normal.        Thought Content: Thought content normal.        Judgment: Judgment normal.   FHT: 165 bpm  MAU Course  Procedures  Results for orders placed or performed during the hospital encounter of 12/17/21 (from the past 24 hour(s))  CBC with Differential/Platelet     Status: Abnormal    Collection Time: 12/17/21  3:12 PM  Result Value Ref Range   WBC 15.0 (H) 4.0 - 10.5 K/uL   RBC 4.56 3.87 - 5.11 MIL/uL   Hemoglobin 12.6 12.0 - 15.0 g/dL   HCT 16.137.4 09.636.0 - 04.546.0 %   MCV 82.0 80.0 - 100.0 fL   MCH 27.6 26.0 - 34.0 pg   MCHC 33.7 30.0 - 36.0 g/dL   RDW 40.912.7 81.111.5 - 91.415.5 %   Platelets 301 150 - 400 K/uL   nRBC 0.0 0.0 - 0.2 %   Neutrophils Relative % 82 %   Neutro Abs 12.2 (H) 1.7 - 7.7 K/uL   Lymphocytes Relative 12 %   Lymphs Abs 1.8 0.7 - 4.0 K/uL   Monocytes Relative 4 %   Monocytes Absolute 0.7 0.1 - 1.0 K/uL   Eosinophils Relative 2 %   Eosinophils Absolute 0.3 0.0 - 0.5 K/uL   Basophils Relative 0 %   Basophils Absolute 0.0 0.0 - 0.1 K/uL   Immature Granulocytes 0 %   Abs Immature Granulocytes 0.05 0.00 - 0.07 K/uL  Comprehensive metabolic panel     Status: Abnormal   Collection Time: 12/17/21  3:12 PM  Result Value Ref Range   Sodium 134 (L) 135 - 145 mmol/L   Potassium 3.6 3.5 - 5.1 mmol/L   Chloride 102 98 - 111 mmol/L   CO2 24 22 - 32 mmol/L   Glucose, Bld 88 70 - 99 mg/dL   BUN 7 6 - 20 mg/dL   Creatinine, Ser 7.820.59 0.44 - 1.00 mg/dL   Calcium 9.4 8.9 - 95.610.3 mg/dL   Total Protein 7.0 6.5 - 8.1 g/dL   Albumin 3.3 (L) 3.5 - 5.0 g/dL   AST 15 15 - 41 U/L   ALT 13 0 - 44 U/L   Alkaline Phosphatase 45 38 - 126 U/L   Total Bilirubin 0.5 0.3 - 1.2 mg/dL   GFR, Estimated >21>60 >30>60 mL/min   Anion gap 8 5 - 15  Amylase     Status: None   Collection Time: 12/17/21  3:12 PM  Result Value Ref Range   Amylase 57 28 - 100 U/L  Lipase, blood     Status: None   Collection Time: 12/17/21  3:12 PM  Result Value Ref Range  Lipase 24 11 - 51 U/L  Urinalysis, Routine w reflex microscopic Urine, Clean Catch     Status: Abnormal   Collection Time: 12/17/21  3:42 PM  Result Value Ref Range   Color, Urine YELLOW YELLOW   APPearance HAZY (A) CLEAR   Specific Gravity, Urine 1.030 1.005 - 1.030   pH 6.0 5.0 - 8.0   Glucose, UA NEGATIVE NEGATIVE mg/dL   Hgb urine  dipstick NEGATIVE NEGATIVE   Bilirubin Urine NEGATIVE NEGATIVE   Ketones, ur 20 (A) NEGATIVE mg/dL   Protein, ur NEGATIVE NEGATIVE mg/dL   Nitrite NEGATIVE NEGATIVE   Leukocytes,Ua MODERATE (A) NEGATIVE   RBC / HPF 0-5 0 - 5 RBC/hpf   WBC, UA 6-10 0 - 5 WBC/hpf   Bacteria, UA RARE (A) NONE SEEN   Squamous Epithelial / LPF 6-10 0 - 5   Mucus PRESENT      US Renal  Result Date: 12/17/2021 CLINICAL DATA:  Right abdominal pain.  First trimester pregnancy. EXAM: RENAL / URINARY TRACT ULTRASOUND COMPLETE COMPARISON:  None Available. FINDINGS: Right Kidney: Renal measurements: 9.9 by 6.2 by 4.9 cm = volume: 156 mL. Echogenicity within normal limits. No mass or hydronephrosis visualized. Left Kidney: Renal measurements: 10.8 by 5.2 by 4.7 cm = volume: 139 mL. Echogenicity within normal limits. No mass or hydronephrosis visualized. Bladder: Appears normal for degree of bladder distention. Incidental intrauterine pregnancy included on one of the bladder images. Other: None. IMPRESSION: 1. Normal sonographic appearance of the kidneys and urinary bladder. There is currently no distention of the right renal pelvis. Electronically Signed   By: Gaylyn Rong M.D.   On: 12/17/2021 20:12   US Abdomen Limited RUQ (LIVER/GB)  Result Date: 12/17/2021 CLINICAL DATA:  Right upper quadrant pain for 3 days EXAM: ULTRASOUND ABDOMEN LIMITED RIGHT UPPER QUADRANT COMPARISON:  11/28/2019 FINDINGS: Gallbladder: No gallstones or wall thickening visualized. No sonographic Murphy sign noted by sonographer. Common bile duct: Diameter: 2 mm Liver: No focal lesion identified. Within normal limits in parenchymal echogenicity. Portal vein is patent on color Doppler imaging with normal direction of blood flow towards the liver. Other: Incidental note is made of mild distension of the right renal pelvis and proximal right ureter without frank hydronephrosis. IMPRESSION: 1. No evidence of cholelithiasis or cholecystitis. 2. Mild  distension of the right renal pelvis and proximal right ureter incidentally noted, without frank hydronephrosis. Etiology unclear. Electronically Signed   By: Sharlet Salina M.D.   On: 12/17/2021 18:38     MDM Labs ordered and reviewed.   UA, UC CBC, CMP, Amylase, Lipase GI cocktail- no relief IM Dilaudid  [place] RUQ  LR bolus- second dose of pain medication given due to significant delay to get to ultrasound  US Renal  Reviewed normal results yet persistent pain. Patient declines MRI at this time, stating she wants apple juice and to go home. Discussed with patient if this pain returns, she should go to [facility] for further evaluation  Assessment and Plan   1. Upper abdominal pain   2. [redacted] weeks gestation of pregnancy     -Discharge home in stable condition -First trimester precautions discussed -Patient advised to follow-up with OB as scheduled for prenatal care -Patient may return to MAU as needed or if her condition were to change or worsen  Rolm Bookbinder, CNM 12/17/2021,

## 2021-12-17 NOTE — Discharge Instructions (Signed)

## 2021-12-18 ENCOUNTER — Other Ambulatory Visit (HOSPITAL_COMMUNITY): Payer: Self-pay

## 2021-12-18 LAB — CULTURE, OB URINE: Culture: <10000 — AB

## 2021-12-26 ENCOUNTER — Other Ambulatory Visit (HOSPITAL_COMMUNITY): Payer: Self-pay

## 2021-12-26 ENCOUNTER — Telehealth (INDEPENDENT_AMBULATORY_CARE_PROVIDER_SITE_OTHER): Payer: Medicaid Other | Admitting: *Deleted

## 2021-12-26 ENCOUNTER — Encounter: Payer: Self-pay | Admitting: *Deleted

## 2021-12-26 DIAGNOSIS — Z3490 Encounter for supervision of normal pregnancy, unspecified, unspecified trimester: Secondary | ICD-10-CM | POA: Insufficient documentation

## 2021-12-26 DIAGNOSIS — O99210 Obesity complicating pregnancy, unspecified trimester: Secondary | ICD-10-CM

## 2021-12-26 DIAGNOSIS — O219 Vomiting of pregnancy, unspecified: Secondary | ICD-10-CM

## 2021-12-26 MED ORDER — GOJJI WEIGHT SCALE MISC: 1.0000 | 0 refills | Status: DC | PRN

## 2021-12-26 MED ORDER — PROMETHAZINE HCL 25 MG PO TABS
25.0000 mg | ORAL_TABLET | Freq: Four times a day (QID) | ORAL | 0 refills | Status: DC | PRN
  Filled 2021-12-26: qty 30, 8d supply, fill #0

## 2021-12-26 MED ORDER — BLOOD PRESSURE KIT DEVI: 1.0000 | 0 refills | Status: AC | PRN

## 2021-12-26 NOTE — Progress Notes (Signed)
New OB Intake  I connected with  Samantha Peters on 12/26/21 at 9:35 by MyChart Video Visit and verified that I am speaking with the correct person using two identifiers. Nurse is located at [facility] and pt is located at home.  I discussed the limitations, risks, security and privacy concerns of performing an evaluation and management service by telephone and the availability of in person appointments. I also discussed with the patient that there may be a patient responsible charge related to this service. The patient expressed understanding and agreed to proceed.  I explained I am completing New OB Intake today. We discussed her EDD of 07/09/22 that is based on LMP of 10/02/21. Pt is G4/P2012. I reviewed her allergies, medications, Medical/Surgical/OB history, and appropriate screenings. I informed her of [facility] services. She declines referral to [facility] at present. She c/o nausea and RX for Phenergan offered and accepted. Based on history, this is a  pregnancy uncomplicated .   Patient Active Problem List   Diagnosis Date Noted   Supervision of low-risk pregnancy 12/26/2021    Concerns addressed today  Delivery Plans:  Plans to deliver at [facility]   MyChart/Babyscripts MyChart access verified. I explained pt will have some visits in office and some virtually. Babyscripts instructions given and order placed.   Blood Pressure Cuff  Blood pressure cuff ordered for patient to pick-up from [employer]. Explained after first prenatal appt pt will check weekly and document in Babyscripts.  Weight scale: Patient does not  have weight scale. Weight scale ordered for patient to pick up from [employer].   Anatomy US Explained first scheduled [place] will be around 19 weeks. Anatomy US will be scheduled and patient notified by MyChart.  Scheduled AFP lab only appointment if CenteringPregnancy pt for same day as anatomy US.   Labs Discussed Avelina Laine genetic screening with patient. Would like both  Panorama drawn and would like drawn sooner than new ob appointment. Lab visit ordred for tomorrow. .Also if interested in genetic testing, tell patient she will need AFP 15-21 weeks to complete genetic testing .Routine prenatal labs needed.  Covid Vaccine Patient has not had covid vaccine.   Is patient a CenteringPregnancy candidate?  Accepted Centering Patient" indicated on sticky note   Is patient a Mom+Baby Combined Care candidate?  Not a candidate      Is patient interested in [facility]?  Yes   "Interested in [employer] - Schedule next visit with CNM" on sticky note  Informed patient of [facility] website  and placed link in her AVS.   Social Determinants of Health Food Insecurity: Patient denies food insecurity. WIC Referral: Patient is interested in referral to [facility].  Transportation: Patient denies transportation needs. Childcare: Discussed no children allowed at ultrasound appointments. Offered childcare services; patient declines childcare services at this time.  Send link to Pregnancy Navigators   Placed OB Box on problem list and updated  First visit review I reviewed new OB appt with pt. I explained she will have a pelvic exam, ob bloodwork with genetic screening, and PAP smear. Explained pt will be seen by Dr. Vergie Living at first visit; encounter routed to appropriate provider. Explained that patient will be seen by pregnancy navigator following visit with provider. [facility] information placed in AVS.   Nancy Fetter 12/26/2021  11:52 AM

## 2021-12-26 NOTE — Patient Instructions (Signed)
[email or web address] is a website with information to help you prepare for Labor and delivery, patient information, visitor information and more.     At our [facility], we work as an integrated team, providing care to address both physical and emotional health. Your medical provider may refer you to see our Behavioral Health Clinician [facility]) on the same day you see your medical provider, as availability permits; often scheduled virtually at your convenience.  Our [facility] is available to all patients, visits generally last between 20-30 minutes, but can be longer or shorter, depending on patient need. The [facility] offers help with stress management, coping with symptoms of depression and anxiety, major life changes , sleep issues, changing risky behavior, grief and loss, life stress, working on personal life goals, and  behavioral health issues, as these all affect your overall health and wellness.  The [facility] is NOT available for the following: FMLA paperwork, court-ordered evaluations, specialty assessments (custody or disability), letters to employers, or obtaining certification for an emotional support animal. The [facility] does not provide long-term therapy. You have the right to refuse integrated behavioral health services, or to reschedule to see the [facility] at a later date.  Confidentiality exception: If it is suspected that a child or disabled adult is being abused or neglected, we are required by law to report that to either Child Protective Services or Adult Management consultant.  If you have a diagnosis of Bipolar affective disorder, Schizophrenia, or recurrent Major depressive disorder, we will recommend that you establish care with a psychiatrist, as these are lifelong, chronic conditions, and we want your overall emotional health and medications to be more closely monitored. If you anticipate needing extended maternity leave due to mental health issues postpartum, it it recommended you inform your medical  provider, so we can put in a referral to a psychiatrist as soon as possible. The [facility] is unable to recommend an extended maternity leave for mental health issues. Your medical provider or [facility] may refer you to a therapist for ongoing, traditional therapy, or to a psychiatrist, for medication management, if it would benefit your overall health. Depending on your insurance, you may have a copay or be charged a deductible, depending on your insurance, to see the [facility]. If you are uninsured, it is recommended that you apply for financial assistance. (Forms may be requested at the front desk for in-person visits, via MyChart, or request a form during a virtual visit).  If you see the [facility] more than 6 times, you will have to complete a comprehensive clinical assessment interview with the [facility] to resume integrated services.  For virtual visits with the [facility], you must be physically in the state of [place] at the time of the visit. For example, if you live in [place], you will have to do an in-person visit with the [facility], and your out-of-state insurance may not cover behavioral health services in [place]. If you are going out of the state or country for any reason, the [facility] may see you virtually when you return to [place], but not while you are physically outside of [place].     Here is a link to the Pregnancy Navigators . Please Fill out prior to your New OB appointment.   English Link: [email or web address]  Spanish Link: [email or web address]

## 2021-12-27 ENCOUNTER — Other Ambulatory Visit: Payer: Self-pay

## 2021-12-27 ENCOUNTER — Other Ambulatory Visit (HOSPITAL_COMMUNITY): Payer: Self-pay

## 2021-12-27 ENCOUNTER — Other Ambulatory Visit: Payer: Medicaid Other

## 2021-12-27 DIAGNOSIS — O99210 Obesity complicating pregnancy, unspecified trimester: Secondary | ICD-10-CM

## 2021-12-27 DIAGNOSIS — Z3490 Encounter for supervision of normal pregnancy, unspecified, unspecified trimester: Secondary | ICD-10-CM

## 2021-12-28 ENCOUNTER — Encounter: Payer: Self-pay | Admitting: Obstetrics and Gynecology

## 2021-12-28 DIAGNOSIS — O09899 Supervision of other high risk pregnancies, unspecified trimester: Secondary | ICD-10-CM | POA: Insufficient documentation

## 2021-12-28 LAB — CBC/D/PLT+RPR+RH+ABO+RUBIGG...
Antibody Screen: NEGATIVE
Basophils Absolute: 0.1 10*3/uL (ref 0.0–0.2)
Basos: 1 %
EOS (ABSOLUTE): 0.5 10*3/uL — ABNORMAL HIGH (ref 0.0–0.4)
Eos: 4 %
HCV Ab: NONREACTIVE
HIV Screen 4th Generation wRfx: NONREACTIVE
Hematocrit: 36.4 % (ref 34.0–46.6)
Hemoglobin: 12.1 g/dL (ref 11.1–15.9)
Hepatitis B Surface Ag: NEGATIVE
Immature Grans (Abs): 0 10*3/uL (ref 0.0–0.1)
Immature Granulocytes: 0 %
Lymphocytes Absolute: 1.8 10*3/uL (ref 0.7–3.1)
Lymphs: 16 %
MCH: 27.1 pg (ref 26.6–33.0)
MCHC: 33.2 g/dL (ref 31.5–35.7)
MCV: 81 fL (ref 79–97)
Monocytes Absolute: 0.8 10*3/uL (ref 0.1–0.9)
Monocytes: 8 %
Neutrophils Absolute: 7.8 10*3/uL — ABNORMAL HIGH (ref 1.4–7.0)
Neutrophils: 71 %
Platelets: 363 10*3/uL (ref 150–450)
RBC: 4.47 x10E6/uL (ref 3.77–5.28)
RDW: 12.6 % (ref 11.7–15.4)
RPR Ser Ql: NONREACTIVE
Rh Factor: POSITIVE
Rubella Antibodies, IGG: <0.9 index — ABNORMAL LOW (ref >0.99)
WBC: 11 10*3/uL — ABNORMAL HIGH (ref 3.4–10.8)

## 2021-12-28 LAB — HEMOGLOBIN A1C
Est. average glucose Bld gHb Est-mCnc: 108 mg/dL
Hgb A1c MFr Bld: 5.4 % (ref 4.8–5.6)

## 2021-12-28 LAB — HCV INTERPRETATION

## 2021-12-31 LAB — CULTURE, OB URINE

## 2021-12-31 LAB — URINE CULTURE, OB REFLEX

## 2022-01-07 ENCOUNTER — Other Ambulatory Visit (HOSPITAL_COMMUNITY): Payer: Self-pay

## 2022-01-20 ENCOUNTER — Encounter: Payer: Medicaid Other | Admitting: Obstetrics and Gynecology

## 2022-01-27 ENCOUNTER — Telehealth: Payer: Self-pay | Admitting: General Practice

## 2022-01-27 NOTE — Telephone Encounter (Signed)
Called patient and informed her of new OB appt on 8/4 and discussed she'll start centering at our second group meeting on 8/15 instead of 7/18 since she missed her new OB appt. Patient verbalized understanding and asked if she would still be having her ultrasound. Told patient yes and reviewed appt information. Patient verbalized understanding & had no other questions.

## 2022-02-08 ENCOUNTER — Inpatient Hospital Stay (HOSPITAL_COMMUNITY)
Admission: AD | Admit: 2022-02-08 | Discharge: 2022-02-09 | Disposition: A | Discharge status: Home / Self Care | Payer: Medicaid Other | Attending: Obstetrics & Gynecology | Admitting: Obstetrics & Gynecology

## 2022-02-08 ENCOUNTER — Encounter (HOSPITAL_COMMUNITY): Payer: Self-pay | Admitting: Obstetrics & Gynecology

## 2022-02-08 DIAGNOSIS — R109 Unspecified abdominal pain: Secondary | ICD-10-CM | POA: Insufficient documentation

## 2022-02-08 DIAGNOSIS — O26892 Other specified pregnancy related conditions, second trimester: Secondary | ICD-10-CM | POA: Insufficient documentation

## 2022-02-08 DIAGNOSIS — Z3A18 18 weeks gestation of pregnancy: Secondary | ICD-10-CM | POA: Insufficient documentation

## 2022-02-08 LAB — URINALYSIS, ROUTINE W REFLEX MICROSCOPIC
Bilirubin Urine: NEGATIVE
Glucose, UA: NEGATIVE mg/dL
Hgb urine dipstick: NEGATIVE
Ketones, ur: NEGATIVE mg/dL
Leukocytes,Ua: NEGATIVE
Nitrite: NEGATIVE
Protein, ur: NEGATIVE mg/dL
Specific Gravity, Urine: 1.008 (ref 1.005–1.030)
pH: 6 (ref 5.0–8.0)

## 2022-02-08 NOTE — MAU Provider Note (Incomplete)
  History     CSN: [id number]  Arrival date and time: 02/08/22 2302   None     No chief complaint on file.  HPI Samantha Peters is a 21 y.o. [id number] at 37w3dwho presents to MAU for ***.   OB History     Gravida  5   Para  2   Term  2   Preterm  0   AB  2   Living  2      SAB  0   IAB  1   Ectopic      Multiple      Live Births  2           Past Medical History:  Diagnosis Date  . Bronchitis   . Elective abortion   . History of umbilical hernia 28891 . Medical history non-contributory     Past Surgical History:  Procedure Laterality Date  . HERNIA REPAIR      Family History  Problem Relation Age of Onset  . Diabetes Mother   . Hypertension Mother   . Heart failure Mother   . Kidney disease Mother   . Hernia Father     Social History   Tobacco Use  . Smoking status: Former    Types: Cigarettes, Cigars    Quit date: 11/06/2021    Years since quitting: 0.2  . Smokeless tobacco: Never  Vaping Use  . Vaping Use: Former  . Quit date: 08/27/2021  Substance Use Topics  . Alcohol use: No  . Drug use: No    Allergies: No Known Allergies  Medications Prior to Admission  Medication Sig Dispense Refill Last Dose  . Prenatal MV & Min w/FA-DHA (PRENATAL GUMMIES PO) Take 1 tablet by mouth daily.   02/08/2022  . Blood Pressure Monitoring (BLOOD PRESSURE KIT) DEVI 1 Device by Does not apply route as needed. 1 each 0   . cetirizine (ZYRTEC) 10 MG tablet Take 1 tablet (10 mg total) by mouth daily. 30 tablet 1   . clotrimazole (GYNE-LOTRIMIN) 1 % vaginal cream Place 1 Applicatorful vaginally at bedtime. 45 g 0   . famotidine (PEPCID) 20 MG tablet Take 1 tablet (20 mg total) by mouth 2 (two) times daily. 60 tablet 2   . fluticasone (FLONASE) 50 MCG/ACT nasal spray Place 2 sprays into both nostrils daily. 16 g 0   . loratadine (CLARITIN) 10 MG tablet Take 1 tablet (10 mg total) by mouth daily. One po daily x 5 days 30 tablet 0   . Misc. Devices (GOJJI  WEIGHT SCALE) MISC 1 Device by Does not apply route as needed. 1 each 0   . ondansetron (ZOFRAN ODT) 4 MG disintegrating tablet Take 1 tablet (4 mg total) by mouth every 8 (eight) hours as needed for nausea or vomiting. (Patient not taking: Reported on 12/26/2021) 10 tablet 0   . promethazine (PHENERGAN) 25 MG tablet Take 1 tablet (25 mg total) by mouth every 6 (six) hours as needed for nausea or vomiting. 30 tablet 0     Review of Systems Physical Exam   Blood pressure 123/60, pulse 84, temperature 97.9 F (36.6 C), temperature source Oral, resp. rate 16, height _0  (1.549 m), weight 74.3 kg, last menstrual period 10/02/2021, SpO2 100 %, unknown if currently breastfeeding.  Physical Exam  MAU Course  Procedures  MDM ***  Assessment and Plan  ***  DRenee Harder7/22/2023, 11:49 PM

## 2022-02-08 NOTE — MAU Note (Signed)
.  Samantha Peters is a 21 y.o. at [redacted]w[redacted]d here in MAU reporting: right lower abdominal pain and pressure that radiates to her buttocks and pelvic area. Denies VB or LOF.   Onset of complaint: today Pain score: 8/10 Vitals:   02/08/22 2315  BP: 109/66  Pulse: 71  Resp: 16  Temp: 97.9 F (36.6 C)  SpO2: 100%     FHT:141 Lab orders placed from triage:  UA

## 2022-02-08 NOTE — MAU Provider Note (Signed)
History     CSN: [id number]  Arrival date and time: 02/08/22 2302   None     Chief Complaint  Patient presents with   Abdominal Pain   HPI Samantha Peters is a 21 y.o. [id number] at [redacted]w[redacted]d who presents to MAU for lower abdominal pain and vaginal pressure. Pain and pressure both started this morning. The pain in abdomen is intermittent and sharp and the vaginal pressure is constant and is worse with standing. She reports she took some Tylenol at 11am as well as a warm bath, both of which did not help. She denies vaginal bleeding, discharge, itching or odor. No urinary s/s, constipation, or diarrhea.   Patient missed her OB appointment at [facility], but is scheduled for an appointment on 8/4.   OB History     Gravida  5   Para  2   Term  2   Preterm  0   AB  2   Living  2      SAB  0   IAB  1   Ectopic      Multiple      Live Births  2           Past Medical History:  Diagnosis Date   Bronchitis    Elective abortion    History of umbilical hernia 2007   Medical history non-contributory     Past Surgical History:  Procedure Laterality Date   HERNIA REPAIR      Family History  Problem Relation Age of Onset   Diabetes Mother    Hypertension Mother    Heart failure Mother    Kidney disease Mother    Hernia Father     Social History   Tobacco Use   Smoking status: Former    Types: Cigarettes, Cigars    Quit date: 11/06/2021    Years since quitting: 0.2   Smokeless tobacco: Never  Vaping Use   Vaping Use: Former   Quit date: 08/27/2021  Substance Use Topics   Alcohol use: No   Drug use: No    Allergies: No Known Allergies  No medications prior to admission.   Review of Systems  Constitutional: Negative.   Respiratory: Negative.    Cardiovascular: Negative.   Gastrointestinal:  Positive for abdominal pain.  Genitourinary:  Positive for pelvic pain. Negative for dysuria, vaginal bleeding and vaginal discharge.  Musculoskeletal: Negative.    Neurological: Negative.    Physical Exam   Patient Vitals for the past 24 hrs:  BP Temp Temp src Pulse Resp SpO2 Height Weight  02/09/22 0149 (!) 100/52 -- -- 80 17 100 % -- --  02/08/22 2330 123/60 -- -- 84 -- 100 % -- --  02/08/22 2315 109/66 97.9 F (36.6 C) Oral 71 16 100 % 5\' 1"  (1.549 m) 74.3 kg    Physical Exam Vitals and nursing note reviewed. Exam conducted with a chaperone present.  Eyes:     Extraocular Movements: Extraocular movements intact.     Pupils: Pupils are equal, round, and reactive to light.  Cardiovascular:     Rate and Rhythm: Normal rate.  Pulmonary:     Effort: Pulmonary effort is normal.  Abdominal:     Palpations: Abdomen is soft.     Tenderness: There is abdominal tenderness in the suprapubic area. There is no guarding or rebound. Negative signs include Murphy's sign.  Genitourinary:    Comments: Blind swabs collected Neurological:     General: No focal deficit present.  Mental Status: She is alert and oriented to person, place, and time.  Psychiatric:        Mood and Affect: Mood normal.        Behavior: Behavior normal.   Dilation: Closed Effacement (%): Thick Exam by:: Camelia Eng, CNM  FHR: 141 bpm via doppler  MAU Course  Procedures  MDM UA negative Wet prep negative, GC/CT pending CBC mild leukocytosis, however similar when compared to past results in epic Cervix is closed/thick. Low suspicion for preterm labor, urinary tract infection/pyelonephritis, kidney stone, or appendicitis. I suspect pain and pressure is likely round ligament. Patient to continue Tylenol prn. May use heat/ice. I highly recommend maternity support belt or athletic tape to help reduce pressure on lower abdomen and pelvis. Continue PO hydration.   Assessment and Plan  [redacted] weeks gestation of pregnancy Pelvic pain affecting pregnancy Abdominal pain affecting pregnancy  - Discharge home in stable condition - Strict return precautions reviewed -  Return to MAU sooner or as needed for worsening symptoms - Keep OB appointment as scheduled   Brand Males, CNM 02/09/2022, 3:38 AM

## 2022-02-09 DIAGNOSIS — Z3A18 18 weeks gestation of pregnancy: Secondary | ICD-10-CM

## 2022-02-09 DIAGNOSIS — O26892 Other specified pregnancy related conditions, second trimester: Secondary | ICD-10-CM | POA: Diagnosis present

## 2022-02-09 DIAGNOSIS — R109 Unspecified abdominal pain: Secondary | ICD-10-CM | POA: Diagnosis not present

## 2022-02-09 LAB — WET PREP, GENITAL
Clue Cells Wet Prep HPF POC: NONE SEEN
Sperm: NONE SEEN
Trich, Wet Prep: NONE SEEN
WBC, Wet Prep HPF POC: >=10 — AB (ref <10)
Yeast Wet Prep HPF POC: NONE SEEN

## 2022-02-09 LAB — CBC WITH DIFFERENTIAL/PLATELET
Abs Immature Granulocytes: 0.07 10*3/uL (ref 0.00–0.07)
Basophils Absolute: 0 10*3/uL (ref 0.0–0.1)
Basophils Relative: 0 %
Eosinophils Absolute: 0.7 10*3/uL — ABNORMAL HIGH (ref 0.0–0.5)
Eosinophils Relative: 5 %
HCT: 31.6 % — ABNORMAL LOW (ref 36.0–46.0)
Hemoglobin: 10.4 g/dL — ABNORMAL LOW (ref 12.0–15.0)
Immature Granulocytes: 1 %
Lymphocytes Relative: 20 %
Lymphs Abs: 2.6 10*3/uL (ref 0.7–4.0)
MCH: 26.9 pg (ref 26.0–34.0)
MCHC: 32.9 g/dL (ref 30.0–36.0)
MCV: 81.7 fL (ref 80.0–100.0)
Monocytes Absolute: 1 10*3/uL (ref 0.1–1.0)
Monocytes Relative: 7 %
Neutro Abs: 8.9 10*3/uL — ABNORMAL HIGH (ref 1.7–7.7)
Neutrophils Relative %: 67 %
Platelets: 302 10*3/uL (ref 150–400)
RBC: 3.87 MIL/uL (ref 3.87–5.11)
RDW: 13.4 % (ref 11.5–15.5)
WBC: 13.3 10*3/uL — ABNORMAL HIGH (ref 4.0–10.5)
nRBC: 0 % (ref 0.0–0.2)

## 2022-02-09 NOTE — Discharge Instructions (Signed)
Maternity support belt Athletic tape

## 2022-02-10 LAB — GC/CHLAMYDIA PROBE AMP (~~LOC~~) NOT AT ARMC
Chlamydia: NEGATIVE
Comment: NEGATIVE
Comment: NORMAL
Neisseria Gonorrhea: NEGATIVE

## 2022-02-12 ENCOUNTER — Ambulatory Visit: Payer: Medicaid Other | Admitting: *Deleted

## 2022-02-12 ENCOUNTER — Ambulatory Visit: Payer: Medicaid Other | Attending: Obstetrics and Gynecology

## 2022-02-12 ENCOUNTER — Encounter: Payer: Self-pay | Admitting: *Deleted

## 2022-02-12 ENCOUNTER — Other Ambulatory Visit: Payer: Self-pay | Admitting: *Deleted

## 2022-02-12 ENCOUNTER — Other Ambulatory Visit: Payer: Medicaid Other

## 2022-02-12 VITALS — BP 125/62 | HR 79

## 2022-02-12 DIAGNOSIS — Z3A19 19 weeks gestation of pregnancy: Secondary | ICD-10-CM | POA: Insufficient documentation

## 2022-02-12 DIAGNOSIS — Z3490 Encounter for supervision of normal pregnancy, unspecified, unspecified trimester: Secondary | ICD-10-CM | POA: Insufficient documentation

## 2022-02-12 DIAGNOSIS — Z3689 Encounter for other specified antenatal screening: Secondary | ICD-10-CM

## 2022-02-12 DIAGNOSIS — O4440 Low lying placenta NOS or without hemorrhage, unspecified trimester: Secondary | ICD-10-CM

## 2022-02-12 DIAGNOSIS — O99212 Obesity complicating pregnancy, second trimester: Secondary | ICD-10-CM | POA: Insufficient documentation

## 2022-02-12 DIAGNOSIS — O99210 Obesity complicating pregnancy, unspecified trimester: Secondary | ICD-10-CM | POA: Diagnosis not present

## 2022-02-12 DIAGNOSIS — Z363 Encounter for antenatal screening for malformations: Secondary | ICD-10-CM | POA: Insufficient documentation

## 2022-02-13 ENCOUNTER — Encounter: Payer: Self-pay | Admitting: Obstetrics and Gynecology

## 2022-02-13 DIAGNOSIS — O4440 Low lying placenta NOS or without hemorrhage, unspecified trimester: Secondary | ICD-10-CM | POA: Insufficient documentation

## 2022-02-21 ENCOUNTER — Encounter: Payer: Self-pay | Admitting: Obstetrics and Gynecology

## 2022-02-21 ENCOUNTER — Other Ambulatory Visit: Payer: Self-pay

## 2022-02-21 ENCOUNTER — Ambulatory Visit (INDEPENDENT_AMBULATORY_CARE_PROVIDER_SITE_OTHER): Payer: Medicaid Other | Admitting: Obstetrics and Gynecology

## 2022-02-21 ENCOUNTER — Other Ambulatory Visit (HOSPITAL_COMMUNITY)
Admission: RE | Admit: 2022-02-21 | Discharge: 2022-02-21 | Disposition: A | Discharge status: Home / Self Care | Payer: Medicaid Other | Source: Ambulatory Visit | Attending: Obstetrics and Gynecology | Admitting: Obstetrics and Gynecology

## 2022-02-21 VITALS — BP 108/66 | HR 71 | Wt 160.6 lb

## 2022-02-21 DIAGNOSIS — Z1151 Encounter for screening for human papillomavirus (HPV): Secondary | ICD-10-CM

## 2022-02-21 DIAGNOSIS — Z3A20 20 weeks gestation of pregnancy: Secondary | ICD-10-CM

## 2022-02-21 DIAGNOSIS — Z1331 Encounter for screening for depression: Secondary | ICD-10-CM

## 2022-02-21 DIAGNOSIS — Z3492 Encounter for supervision of normal pregnancy, unspecified, second trimester: Secondary | ICD-10-CM

## 2022-02-21 DIAGNOSIS — O4440 Low lying placenta NOS or without hemorrhage, unspecified trimester: Secondary | ICD-10-CM

## 2022-02-21 DIAGNOSIS — Z5941 Food insecurity: Secondary | ICD-10-CM

## 2022-02-21 DIAGNOSIS — O4442 Low lying placenta NOS or without hemorrhage, second trimester: Secondary | ICD-10-CM

## 2022-02-24 ENCOUNTER — Encounter: Payer: Self-pay | Admitting: *Deleted

## 2022-02-24 NOTE — Progress Notes (Signed)
   PRENATAL VISIT NOTE  Subjective:  Samantha Peters is a 21 y.o. [id number] at [redacted]w[redacted]d being seen today for ongoing prenatal care.  She is currently monitored for the following issues for this low-risk pregnancy and has Supervision of low-risk pregnancy; Rubella non-immune status, antepartum; and Low-lying placenta on their problem list.  Patient reports no complaints.  Contractions: Not present. Vag. Bleeding: None.  Movement: Present. Denies leaking of fluid.   The following portions of the patient's history were reviewed and updated as appropriate: allergies, current medications, past family history, past medical history, past social history, past surgical history and problem list.   Objective:   Vitals:   02/21/22 1050  BP: 108/66  Pulse: 71  Weight: 160 lb 9.6 oz (72.8 kg)    Fetal Status: Fetal Heart Rate (bpm): 145   Movement: Present     General:  Alert, oriented and cooperative. Patient is in no acute distress.  Skin: Skin is warm and dry. No rash noted.   Cardiovascular: Normal heart rate noted  Respiratory: Normal respiratory effort, no problems with respiration noted  Abdomen: Soft, gravid, appropriate for gestational age.  Pain/Pressure: Present     Pelvic: Cervical exam deferred        Extremities: Normal range of motion.  Edema: None  Mental Status: Normal mood and affect. Normal behavior. Normal judgment and thought content.   Assessment and Plan:  Pregnancy: [id number] at [redacted]w[redacted]d 1. Encounter for supervision of low-risk pregnancy in second trimester Pap smear done today  2. Screening for human papillomavirus (HPV) - Cytology - PAP( [place])  3. Positive depression screening - Ambulatory referral to Integrated Behavioral Health  4. Food insecurity - AMBULATORY REFERRAL TO [facility]  5. Low-lying placenta D/w her recommendation for pelvic rest and most likely will clear with time. MAU precautions given  6. [redacted] weeks gestation of pregnancy Normal  anatomy on 7/26 and anterior-right low lying (61mm from internal os) placenta. Pt with prior vag deliveries  Preterm labor symptoms and general obstetric precautions including but not limited to vaginal bleeding, contractions, leaking of fluid and fetal movement were reviewed in detail with the patient. Please refer to After Visit Summary for other counseling recommendations.   No follow-ups on file.  Future Appointments  Date Time Provider Department Center  03/04/2022  9:00 AM CENTERING PROVIDER [facility]  03/12/2022  7:30 AM [facility] NURSE [facility]  03/12/2022  7:45 AM [facility] US5 [facility]  04/01/2022  9:00 AM CENTERING PROVIDER [facility]  04/15/2022  9:00 AM CENTERING PROVIDER [facility]  04/29/2022  9:00 AM CENTERING PROVIDER [facility]  05/13/2022  9:00 AM CENTERING PROVIDER [facility]  05/27/2022  9:00 AM CENTERING PROVIDER [facility]  06/10/2022  9:00 AM CENTERING PROVIDER [facility]  06/24/2022  9:00 AM CENTERING PROVIDER [facility]    [place] Bing, MD

## 2022-02-25 ENCOUNTER — Telehealth: Payer: Self-pay | Admitting: General Practice

## 2022-02-25 NOTE — Telephone Encounter (Signed)
Called patient and reviewed upcoming centering appt & relevant information. Patient verbalized understanding.

## 2022-02-26 LAB — CYTOLOGY - PAP
Comment: NEGATIVE
Diagnosis: UNDETERMINED — AB
High risk HPV: POSITIVE — AB

## 2022-02-27 ENCOUNTER — Encounter: Payer: Self-pay | Admitting: Obstetrics and Gynecology

## 2022-02-27 DIAGNOSIS — N879 Dysplasia of cervix uteri, unspecified: Secondary | ICD-10-CM | POA: Insufficient documentation

## 2022-03-03 NOTE — Progress Notes (Signed)
No show

## 2022-03-04 ENCOUNTER — Encounter: Payer: Medicaid Other | Admitting: Women's Health

## 2022-03-04 DIAGNOSIS — Z3492 Encounter for supervision of normal pregnancy, unspecified, second trimester: Secondary | ICD-10-CM

## 2022-03-04 DIAGNOSIS — Z2839 Other underimmunization status: Secondary | ICD-10-CM

## 2022-03-04 DIAGNOSIS — O4440 Low lying placenta NOS or without hemorrhage, unspecified trimester: Secondary | ICD-10-CM

## 2022-03-04 DIAGNOSIS — N879 Dysplasia of cervix uteri, unspecified: Secondary | ICD-10-CM

## 2022-03-04 DIAGNOSIS — Z3A21 21 weeks gestation of pregnancy: Secondary | ICD-10-CM

## 2022-03-05 NOTE — BH Specialist Note (Signed)
Pt did not arrive to video visit and did not answer the phone; Unable to leave voice message as mailbox is not set up; left MyChart message for patient.

## 2022-03-12 ENCOUNTER — Ambulatory Visit: Payer: Medicaid Other | Attending: Obstetrics

## 2022-03-12 ENCOUNTER — Ambulatory Visit: Payer: Medicaid Other

## 2022-03-12 ENCOUNTER — Encounter: Payer: Self-pay | Admitting: *Deleted

## 2022-03-18 ENCOUNTER — Ambulatory Visit: Payer: Medicaid Other | Admitting: Clinical

## 2022-03-18 DIAGNOSIS — Z91199 Patient's noncompliance with other medical treatment and regimen due to unspecified reason: Secondary | ICD-10-CM

## 2022-03-26 ENCOUNTER — Ambulatory Visit: Payer: Medicaid Other | Admitting: Clinical

## 2022-03-26 DIAGNOSIS — Z91199 Patient's noncompliance with other medical treatment and regimen due to unspecified reason: Secondary | ICD-10-CM

## 2022-03-26 NOTE — BH Specialist Note (Signed)
Pt did not arrive to video visit and did not answer the phone; Unable to leave message; left MyChart message for patient.

## 2022-03-27 ENCOUNTER — Telehealth: Payer: Self-pay | Admitting: General Practice

## 2022-03-27 NOTE — Telephone Encounter (Signed)
Called patient to review upcoming centering appt information, no answer- unable to leave message as voicemail was not set up. Will send mychart message.

## 2022-04-01 ENCOUNTER — Ambulatory Visit (INDEPENDENT_AMBULATORY_CARE_PROVIDER_SITE_OTHER): Payer: Medicaid Other | Admitting: Women's Health

## 2022-04-01 ENCOUNTER — Encounter: Payer: Self-pay | Admitting: General Practice

## 2022-04-01 VITALS — BP 120/78 | HR 111 | Wt 159.0 lb

## 2022-04-01 DIAGNOSIS — Z2839 Other underimmunization status: Secondary | ICD-10-CM

## 2022-04-01 DIAGNOSIS — O4440 Low lying placenta NOS or without hemorrhage, unspecified trimester: Secondary | ICD-10-CM

## 2022-04-01 DIAGNOSIS — N879 Dysplasia of cervix uteri, unspecified: Secondary | ICD-10-CM

## 2022-04-01 DIAGNOSIS — Z3492 Encounter for supervision of normal pregnancy, unspecified, second trimester: Secondary | ICD-10-CM

## 2022-04-01 DIAGNOSIS — O09899 Supervision of other high risk pregnancies, unspecified trimester: Secondary | ICD-10-CM

## 2022-04-01 DIAGNOSIS — Z3A25 25 weeks gestation of pregnancy: Secondary | ICD-10-CM

## 2022-04-01 NOTE — Patient Instructions (Addendum)
Considering Waterbirth? Guide for patients at Center for [facility]) Why consider waterbirth? Gentle birth for babies  Less pain medicine used in labor  May allow for passive descent/less pushing  May reduce perineal tears  More mobility and instinctive maternal position changes  Increased maternal relaxation   Is waterbirth safe? What are the risks of infection, drowning or other complications? Infection:  Very low risk (3.7 % for tub vs 4.8% for bed)  7 in 8000 waterbirths with documented infection  Poorly cleaned equipment most common cause  Slightly lower group B strep transmission rate  Drowning  Maternal:  Very low risk  Related to seizures or fainting  Newborn:  Very low risk. No evidence of increased risk of respiratory problems in multiple large studies  Physiological protection from breathing under water  Avoid underwater birth if there are any fetal complications  Once baby's head is out of the water, keep it out.  Birth complication  Some reports of cord trauma, but risk decreased by bringing baby to surface gradually  No evidence of increased risk of shoulder dystocia. Mothers can usually change positions faster in water than in a bed, possibly aiding the maneuvers to free the shoulder.   There are 2 things you MUST do to have a waterbirth with [facility]: Attend a waterbirth class at [employer] [facility] at [facility]   3rd Wednesday of every month from 7-9 pm (virtual during COVID) [employer] at [email or web address] or [email or web address] or by calling [phone] Bring [place] the certificate from the class to your prenatal appointment or send via MyChart Meet with a midwife at 36 weeks* to see if you can still plan a waterbirth and to sign the consent.   *We also recommend that you schedule as many of your prenatal visits with a midwife as possible.    Helpful information: You may want to bring a bathing suit top to the hospital  to wear during labor but this is optional.  All other supplies are provided by the hospital. Please arrive at the hospital with signs of active labor, and do not wait at home until late in labor. It takes 45 min- 1 hour for fetal monitoring, and check in to your room to take place, plus transport and filling of the waterbirth tub.    Things that would prevent you from having a waterbirth: Premature, <37wks  Previous cesarean birth  Presence of thick meconium-stained fluid  Multiple gestation (Twins, triplets, etc.)  Uncontrolled diabetes or gestational diabetes requiring medication  Hypertension diagnosed in pregnancy or preexisting hypertension (gestational hypertension, preeclampsia, or chronic hypertension) Fetal growth restriction (your baby measures less than 10th percentile on ultrasound) Heavy vaginal bleeding  Non-reassuring fetal heart rate  Active infection (MRSA, etc.). Group B Strep is NOT a contraindication for waterbirth.  If your labor has to be induced and induction method requires continuous monitoring of the baby's heart rate  Other risks/issues identified by your obstetrical provider   Please remember that birth is unpredictable. Under certain unforeseeable circumstances your provider may advise against giving birth in the tub. These decisions will be made on a case-by-case basis and with the safety of you and your baby as our highest priority.    Updated 10/23/21        Contraindications to Waterbirth at [facility]:   History of c-section  Preterm birth less than 37 weeks  Thick, particulate meconium-stained fluid  Maternal fever over 101 degrees Fahrenheit  Heavy bleeding or signs of placental  abruption  Pre-eclampsia, Chronic hypertension or Gestational Hypertension  Any abnormal fetal heart rate pattern  If epidural analgesia is utilized during Chief Executive [employer]  Multiple gestation pregnancy  Active communicable disease   Significant limitation to  mobility  Preexisting Diabetes, A2 Gestational diabetes or Uncontrolled A1 Gestational diabetes  Any other indication based on medical provider discretion  Special Considerations: Some maternal conditions that may become contraindications by provider discretion are history of seizure or syncope, especially without documentation of management or resolution.  The patient will be required to leave the birthing tub if there is a situation warranting a more complete  fetal assessment, continuous fetal monitoring and/or the necessity for the infant to be delivered outside  the tub.       Childbirth Education Options: [facility] Department Classes:  Childbirth education classes can help you get ready for a positive parenting experience. You can also meet other expectant parents and get free stuff for your baby. Each class runs for five weeks on the same night and costs $45 for the mother-to-be and her support person. Medicaid covers the cost if you are eligible. Call [phone] to register. [facility] Childbirth Education: Classes can vary in availability and schedule is subject to change. For most up-to-date information please visit [email or web address] to review and register.

## 2022-04-01 NOTE — Progress Notes (Signed)
Subjective:  Samantha Peters is a 21 y.o. [phone] at [redacted]w[redacted]d being seen today for ongoing prenatal care.  She is currently monitored for the following issues for this low-risk pregnancy and has Supervision of low-risk pregnancy; Rubella non-immune status, antepartum; Low-lying placenta; and Cervical dysplasia on their problem list.  Patient reports no complaints.   .  .   . Denies leaking of fluid.   The following portions of the patient's history were reviewed and updated as appropriate: allergies, current medications, past family history, past medical history, past social history, past surgical history and problem list. Problem list updated.  Objective:   Vitals:   04/01/22 0927  BP: 120/78  Pulse: (!) 111  Weight: 159 lb (72.1 kg)    Fetal Status:           General:  Alert, oriented and cooperative. Patient is in no acute distress.  Skin: Skin is warm and dry. No rash noted.   Cardiovascular: Normal heart rate noted  Respiratory: Normal respiratory effort, no problems with respiration noted  Abdomen: Soft, gravid, appropriate for gestational age.       Pelvic:       Cervical exam deferred        Extremities: Normal range of motion.     Mental Status: Normal mood and affect. Normal behavior. Normal judgment and thought content.   Urinalysis:      Assessment and Plan:  Pregnancy: [id number] at [redacted]w[redacted]d  1. Encounter for supervision of low-risk pregnancy in second trimester -interested in waterbirth, class registration information given, advised to take class scheduled for next week -CBE info given  2. Rubella non-immune status, antepartum -vaccine PP  3. Low-lying placenta -[place] tomorrow for f/u  4. Cervical dysplasia -ASCUS/HPV+, repeat Pap 1 year  5. [redacted] weeks gestation of pregnancy  Preterm labor symptoms and general obstetric precautions including but not limited to vaginal bleeding, contractions, leaking of fluid and fetal movement were reviewed in detail with the  patient. I discussed the assessment and treatment plan with the patient. The patient was provided an opportunity to ask questions and all were answered. The patient agreed with the plan and demonstrated an understanding of the instructions. The patient was advised to call back or seek an in-person office evaluation/go to MAU at [facility] for any urgent or concerning symptoms. Please refer to After Visit Summary for other counseling recommendations.  Return in about 2 weeks (around 04/15/2022) for Centering, needs GTT.   Aimie Wagman, Odie Sera, NP

## 2022-04-02 ENCOUNTER — Ambulatory Visit: Payer: Medicaid Other | Attending: Obstetrics

## 2022-04-02 ENCOUNTER — Ambulatory Visit: Payer: Medicaid Other | Admitting: *Deleted

## 2022-04-02 ENCOUNTER — Encounter: Payer: Self-pay | Admitting: *Deleted

## 2022-04-02 VITALS — BP 111/65 | HR 101

## 2022-04-02 DIAGNOSIS — Z3A26 26 weeks gestation of pregnancy: Secondary | ICD-10-CM | POA: Diagnosis not present

## 2022-04-02 DIAGNOSIS — Z363 Encounter for antenatal screening for malformations: Secondary | ICD-10-CM | POA: Insufficient documentation

## 2022-04-02 DIAGNOSIS — Z3492 Encounter for supervision of normal pregnancy, unspecified, second trimester: Secondary | ICD-10-CM | POA: Diagnosis present

## 2022-04-02 DIAGNOSIS — E669 Obesity, unspecified: Secondary | ICD-10-CM | POA: Diagnosis not present

## 2022-04-02 DIAGNOSIS — O4442 Low lying placenta NOS or without hemorrhage, second trimester: Secondary | ICD-10-CM | POA: Diagnosis not present

## 2022-04-02 DIAGNOSIS — O4440 Low lying placenta NOS or without hemorrhage, unspecified trimester: Secondary | ICD-10-CM | POA: Insufficient documentation

## 2022-04-02 DIAGNOSIS — O99212 Obesity complicating pregnancy, second trimester: Secondary | ICD-10-CM | POA: Diagnosis not present

## 2022-04-03 ENCOUNTER — Other Ambulatory Visit: Payer: Self-pay | Admitting: *Deleted

## 2022-04-03 DIAGNOSIS — O99212 Obesity complicating pregnancy, second trimester: Secondary | ICD-10-CM

## 2022-04-14 ENCOUNTER — Other Ambulatory Visit: Payer: Self-pay | Admitting: General Practice

## 2022-04-14 DIAGNOSIS — Z3492 Encounter for supervision of normal pregnancy, unspecified, second trimester: Secondary | ICD-10-CM

## 2022-04-15 ENCOUNTER — Other Ambulatory Visit: Payer: Medicaid Other

## 2022-04-15 ENCOUNTER — Other Ambulatory Visit: Payer: Self-pay

## 2022-04-15 DIAGNOSIS — Z3492 Encounter for supervision of normal pregnancy, unspecified, second trimester: Secondary | ICD-10-CM

## 2022-04-16 ENCOUNTER — Other Ambulatory Visit (INDEPENDENT_AMBULATORY_CARE_PROVIDER_SITE_OTHER): Payer: Medicaid Other | Admitting: Family Medicine

## 2022-04-16 ENCOUNTER — Other Ambulatory Visit (HOSPITAL_COMMUNITY): Payer: Self-pay

## 2022-04-16 ENCOUNTER — Ambulatory Visit (INDEPENDENT_AMBULATORY_CARE_PROVIDER_SITE_OTHER): Payer: Medicaid Other | Admitting: Advanced Practice Midwife

## 2022-04-16 VITALS — BP 118/78 | HR 84 | Wt 157.0 lb

## 2022-04-16 DIAGNOSIS — O99013 Anemia complicating pregnancy, third trimester: Secondary | ICD-10-CM

## 2022-04-16 DIAGNOSIS — Z3A28 28 weeks gestation of pregnancy: Secondary | ICD-10-CM

## 2022-04-16 DIAGNOSIS — O4440 Low lying placenta NOS or without hemorrhage, unspecified trimester: Secondary | ICD-10-CM

## 2022-04-16 DIAGNOSIS — Z3490 Encounter for supervision of normal pregnancy, unspecified, unspecified trimester: Secondary | ICD-10-CM

## 2022-04-16 DIAGNOSIS — N879 Dysplasia of cervix uteri, unspecified: Secondary | ICD-10-CM

## 2022-04-16 DIAGNOSIS — O09893 Supervision of other high risk pregnancies, third trimester: Secondary | ICD-10-CM

## 2022-04-16 DIAGNOSIS — Z23 Encounter for immunization: Secondary | ICD-10-CM | POA: Diagnosis not present

## 2022-04-16 DIAGNOSIS — O4443 Low lying placenta NOS or without hemorrhage, third trimester: Secondary | ICD-10-CM

## 2022-04-16 DIAGNOSIS — Z3493 Encounter for supervision of normal pregnancy, unspecified, third trimester: Secondary | ICD-10-CM

## 2022-04-16 DIAGNOSIS — O09899 Supervision of other high risk pregnancies, unspecified trimester: Secondary | ICD-10-CM

## 2022-04-16 DIAGNOSIS — Z2839 Other underimmunization status: Secondary | ICD-10-CM

## 2022-04-16 LAB — CBC
Hematocrit: 30.2 % — ABNORMAL LOW (ref 34.0–46.6)
Hemoglobin: 10 g/dL — ABNORMAL LOW (ref 11.1–15.9)
MCH: 26.4 pg — ABNORMAL LOW (ref 26.6–33.0)
MCHC: 33.1 g/dL (ref 31.5–35.7)
MCV: 80 fL (ref 79–97)
Platelets: 345 10*3/uL (ref 150–450)
RBC: 3.79 x10E6/uL (ref 3.77–5.28)
RDW: 13.5 % (ref 11.7–15.4)
WBC: 13 10*3/uL — ABNORMAL HIGH (ref 3.4–10.8)

## 2022-04-16 LAB — GLUCOSE TOLERANCE, 2 HOURS W/ 1HR
Glucose, 1 hour: 100 mg/dL (ref 70–179)
Glucose, 2 hour: 102 mg/dL (ref 70–152)
Glucose, Fasting: 73 mg/dL (ref 70–91)

## 2022-04-16 LAB — RPR: RPR Ser Ql: NONREACTIVE

## 2022-04-16 LAB — HIV ANTIBODY (ROUTINE TESTING W REFLEX): HIV Screen 4th Generation wRfx: NONREACTIVE

## 2022-04-16 MED ORDER — FERROUS SULFATE 325 (65 FE) MG PO TABS
325.0000 mg | ORAL_TABLET | ORAL | 1 refills | Status: AC
  Filled 2022-04-16: qty 45, 90d supply, fill #0

## 2022-04-16 MED ORDER — TETANUS-DIPHTH-ACELL PERTUSSIS 5-2.5-18.5 LF-MCG/0.5 IM SUSY
0.5000 mL | PREFILLED_SYRINGE | Freq: Once | INTRAMUSCULAR | Status: AC
  Administered 2022-04-16: 0.5 mL via INTRAMUSCULAR

## 2022-04-17 NOTE — Progress Notes (Signed)
   PRENATAL VISIT NOTE  Subjective:  Samantha Peters is a 21 y.o. [id number] at [redacted]w[redacted]d being seen today for ongoing prenatal care.  She is currently monitored for the following issues for this low-risk pregnancy and has Supervision of low-risk pregnancy; Rubella non-immune status, antepartum; Low-lying placenta; Cervical dysplasia; and Anemia complicating pregnancy, third trimester on their problem list.  Patient reports  post-coital spotting. None now .  Contractions: Not present. Vag. Bleeding: None.  Movement: Present. Denies leaking of fluid.   The following portions of the patient's history were reviewed and updated as appropriate: allergies, current medications, past family history, past medical history, past social history, past surgical history and problem list.   Objective:   Vitals:   04/16/22 0856  BP: 118/78  Pulse: 84  Weight: 157 lb (71.2 kg)    Fetal Status: Fetal Heart Rate (bpm): 145 Fundal Height: 28 cm Movement: Present     General:  Alert, oriented and cooperative. Patient is in no acute distress.  Skin: Skin is warm and dry. No rash noted.   Cardiovascular: Normal heart rate noted  Respiratory: Normal respiratory effort, no problems with respiration noted  Abdomen: Soft, gravid, appropriate for gestational age.  Pain/Pressure: Present     Pelvic: declined         Extremities: Normal range of motion.  Edema: None  Mental Status: Normal mood and affect. Normal behavior. Normal judgment and thought content.   Assessment and Plan:  Pregnancy: [id number] at [redacted]w[redacted]d 1. Encounter for supervision of low-risk pregnancy, antepartum   2. Need for Tdap vaccination - TDaP given  3. Needs flu shot - Flu vaccine given  4. Cervical dysplasia - Repeat Pap 1 year  5. Low-lying placenta - resolved  6. Rubella non-immune status, antepartum - MMR PP  7. Anemia complicating pregnancy, third trimester FeSo4  8. [redacted] weeks gestation of pregnancy  9. Post-coital spotting (None  now) - No longer a concern for Low-lying placenta - Pelvic rest x 1 week. Call for worsening bleeding, contractions or bleeding not associated w/ IC.       Centering Pregnancy, Session 3/5 (Newly combined): Reviewed resources in [employer].    Facilitated discussion today:  - "The Family I Want to Have" traditions/parenting practices to continue and discontinue - Reach Out and Read: Pediatric literacy, reading as a means for bonding with baby - Parenting role discussion - Preterm labor vs normal discomforts of pregnancy - Flu vaccine recommended - TDaP vaccine recommended   Fundal height and FHR appropriate today unless noted otherwise in plan. Patient to continue group care.    Preterm labor symptoms and general obstetric precautions including but not limited to vaginal bleeding, contractions, leaking of fluid and fetal movement were reviewed in detail with the patient. Please refer to After Visit Summary for other counseling recommendations.   Return in about 4 weeks (around 05/14/2022) for as scheduled.  Future Appointments  Date Time Provider [place]  04/30/2022 10:30 AM [facility] NURSE [facility]  04/30/2022 10:45 AM [facility] US4 [facility]  05/14/2022  9:00 AM CENTERING PROVIDER [facility]  05/28/2022  9:00 AM CENTERING PROVIDER [facility]  05/28/2022 11:15 AM [facility] NURSE [facility]  05/28/2022 11:30 AM [facility] US2 [facility]  06/11/2022  9:00 AM CENTERING PROVIDER [facility]  06/25/2022  9:00 AM CENTERING PROVIDER [facility]  07/09/2022  9:00 AM CENTERING PROVIDER [facility] [place], CNM

## 2022-04-17 NOTE — Patient Instructions (Signed)

## 2022-04-30 ENCOUNTER — Ambulatory Visit: Payer: Medicaid Other | Admitting: *Deleted

## 2022-04-30 ENCOUNTER — Ambulatory Visit: Payer: Medicaid Other | Attending: Obstetrics and Gynecology

## 2022-04-30 ENCOUNTER — Encounter: Payer: Self-pay | Admitting: *Deleted

## 2022-04-30 DIAGNOSIS — O99212 Obesity complicating pregnancy, second trimester: Secondary | ICD-10-CM | POA: Diagnosis not present

## 2022-04-30 DIAGNOSIS — E669 Obesity, unspecified: Secondary | ICD-10-CM | POA: Diagnosis not present

## 2022-04-30 DIAGNOSIS — Z3A30 30 weeks gestation of pregnancy: Secondary | ICD-10-CM

## 2022-04-30 DIAGNOSIS — O4443 Low lying placenta NOS or without hemorrhage, third trimester: Secondary | ICD-10-CM | POA: Diagnosis not present

## 2022-05-08 ENCOUNTER — Encounter: Payer: Self-pay | Admitting: *Deleted

## 2022-05-09 ENCOUNTER — Inpatient Hospital Stay (HOSPITAL_COMMUNITY)
Admission: AD | Admit: 2022-05-09 | Discharge: 2022-05-09 | Disposition: A | Discharge status: Home / Self Care | Payer: Medicaid Other | Attending: Obstetrics & Gynecology | Admitting: Obstetrics & Gynecology

## 2022-05-09 ENCOUNTER — Encounter (HOSPITAL_COMMUNITY): Payer: Self-pay | Admitting: Obstetrics & Gynecology

## 2022-05-09 ENCOUNTER — Other Ambulatory Visit: Payer: Self-pay

## 2022-05-09 ENCOUNTER — Inpatient Hospital Stay (HOSPITAL_BASED_OUTPATIENT_CLINIC_OR_DEPARTMENT_OTHER): Payer: Medicaid Other

## 2022-05-09 ENCOUNTER — Other Ambulatory Visit (HOSPITAL_COMMUNITY): Payer: Self-pay

## 2022-05-09 DIAGNOSIS — O26853 Spotting complicating pregnancy, third trimester: Secondary | ICD-10-CM | POA: Diagnosis present

## 2022-05-09 DIAGNOSIS — O4693 Antepartum hemorrhage, unspecified, third trimester: Secondary | ICD-10-CM | POA: Diagnosis not present

## 2022-05-09 DIAGNOSIS — N949 Unspecified condition associated with female genital organs and menstrual cycle: Secondary | ICD-10-CM

## 2022-05-09 DIAGNOSIS — Z3A31 31 weeks gestation of pregnancy: Secondary | ICD-10-CM | POA: Diagnosis not present

## 2022-05-09 DIAGNOSIS — Z3689 Encounter for other specified antenatal screening: Secondary | ICD-10-CM

## 2022-05-09 DIAGNOSIS — Z3492 Encounter for supervision of normal pregnancy, unspecified, second trimester: Secondary | ICD-10-CM

## 2022-05-09 LAB — CBC
HCT: 30.1 % — ABNORMAL LOW (ref 36.0–46.0)
Hemoglobin: 10.4 g/dL — ABNORMAL LOW (ref 12.0–15.0)
MCH: 28 pg (ref 26.0–34.0)
MCHC: 34.6 g/dL (ref 30.0–36.0)
MCV: 80.9 fL (ref 80.0–100.0)
Platelets: 307 10*3/uL (ref 150–400)
RBC: 3.72 MIL/uL — ABNORMAL LOW (ref 3.87–5.11)
RDW: 13.7 % (ref 11.5–15.5)
WBC: 14.6 10*3/uL — ABNORMAL HIGH (ref 4.0–10.5)
nRBC: 0 % (ref 0.0–0.2)

## 2022-05-09 LAB — URINALYSIS, ROUTINE W REFLEX MICROSCOPIC
Bilirubin Urine: NEGATIVE
Glucose, UA: NEGATIVE mg/dL
Hgb urine dipstick: NEGATIVE
Ketones, ur: 5 mg/dL — AB
Nitrite: NEGATIVE
Protein, ur: 30 mg/dL — AB
Specific Gravity, Urine: 1.021 (ref 1.005–1.030)
pH: 6 (ref 5.0–8.0)

## 2022-05-09 LAB — WET PREP, GENITAL
Clue Cells Wet Prep HPF POC: NONE SEEN
Sperm: NONE SEEN
Trich, Wet Prep: NONE SEEN
WBC, Wet Prep HPF POC: >=10 — AB (ref <10)
Yeast Wet Prep HPF POC: NONE SEEN

## 2022-05-09 MED ORDER — ACETAMINOPHEN 500 MG PO TABS
1000.0000 mg | ORAL_TABLET | Freq: Once | ORAL | Status: AC
  Administered 2022-05-09: 1000 mg via ORAL
  Filled 2022-05-09: qty 2

## 2022-05-09 MED ORDER — CYCLOBENZAPRINE HCL 5 MG PO TABS
10.0000 mg | ORAL_TABLET | Freq: Once | ORAL | Status: AC
  Administered 2022-05-09: 10 mg via ORAL
  Filled 2022-05-09: qty 2

## 2022-05-09 MED ORDER — CYCLOBENZAPRINE HCL 10 MG PO TABS
10.0000 mg | ORAL_TABLET | Freq: Two times a day (BID) | ORAL | 0 refills | Status: DC | PRN
  Filled 2022-05-09: qty 20, 10d supply, fill #0

## 2022-05-09 NOTE — MAU Provider Note (Signed)
History    CSN: [id number]  Arrival date and time: 05/09/22 1516  Event Date/Time  First Provider Initiated Contact with Patient 05/09/22 1553      Chief Complaint  Patient presents with   Vaginal Bleeding   Abdominal Pain    Samantha Peters is a 21 y.o. [id number] who presents to MAU with chief complaint of vaginal spotting. This is a new problem, onset today. Patient reports seeing smears of blood when she wiped after voiding. She denies ongoing overt bleeding. She denies leaking of fluid, decreased fetal movement, fever, falls, or recent illness. She is remote from sexual intercourse.  Patient also reports pain across her lower abdomen and bilateral groin pain which radiates towards her hips. Pain score 5/10. Pain improves with rest but is still present. She has not taken medication or tried other treatments for this complaint.  Patient is a MCW/Centering patient.  OB History     Gravida  4   Para  2   Term  2   Preterm  0   AB  1   Living  2      SAB  0   IAB  1   Ectopic      Multiple      Live Births  2           Past Medical History:  Diagnosis Date   Bronchitis    Elective abortion    History of umbilical hernia 2778   Medical history non-contributory     Past Surgical History:  Procedure Laterality Date   HERNIA REPAIR      Family History  Problem Relation Age of Onset   Diabetes Mother    Hypertension Mother    Heart failure Mother    Kidney disease Mother    Hernia Father     Social History   Tobacco Use   Smoking status: Former    Types: Cigarettes, Cigars    Quit date: 11/06/2021    Years since quitting: 0.5   Smokeless tobacco: Never  Vaping Use   Vaping Use: Former   Quit date: 08/27/2021   Substances: Flavoring  Substance Use Topics   Alcohol use: No   Drug use: No    Allergies: No Known Allergies  Medications Prior to Admission  Medication Sig Dispense Refill Last Dose   Prenatal MV & Min w/FA-DHA (PRENATAL  GUMMIES PO) Take 1 tablet by mouth daily.   05/09/2022   Blood Pressure Monitoring (BLOOD PRESSURE KIT) DEVI 1 Device by Does not apply route as needed. 1 each 0    cetirizine (ZYRTEC) 10 MG tablet Take 1 tablet (10 mg total) by mouth daily. 30 tablet 1 More than a month   ferrous sulfate (FERROUSUL) 325 (65 FE) MG tablet Take 1 tablet (325 mg total) by mouth every other day. 60 tablet 1    Misc. Devices (GOJJI WEIGHT SCALE) MISC 1 Device by Does not apply route as needed. (Patient not taking: Reported on 04/01/2022) 1 each 0     Review of Systems  Gastrointestinal:  Positive for abdominal pain.  Genitourinary:  Positive for vaginal bleeding.  All other systems reviewed and are negative.  Physical Exam   Blood pressure 113/69, pulse (!) 110, temperature 98.2 F (36.8 C), resp. rate 18, height _0  (1.549 m), weight 68 kg, last menstrual period 10/02/2021, unknown if currently breastfeeding.  Physical Exam Vitals and nursing note reviewed. Exam conducted with a chaperone present.  Constitutional:  Appearance: She is well-developed.  Cardiovascular:     Rate and Rhythm: Normal rate and regular rhythm.  Pulmonary:     Effort: Pulmonary effort is normal.     Breath sounds: Normal breath sounds.  Abdominal:     Comments: Gravid  Genitourinary:    Comments: Pelvic exam: External genitalia normal, vaginal walls pink and well rugated, cervix visually closed, no lesions noted. No blood or abnormal discharge noted.  Skin:    Capillary Refill: Capillary refill takes less than 2 seconds.  Neurological:     Mental Status: She is alert and oriented to person, place, and time.  Psychiatric:        Mood and Affect: Mood normal.        Behavior: Behavior normal.     MAU Course  Procedures  MDM  --No bleeding or evidence of recent bleeding assessed on speculum exam. UA negative for hemoglobin --Reactive tracing: baseline 145, mod var, + accels, no decels --Toco: quiet  Orders  Placed This Encounter  Procedures   Wet prep, genital   [place] MFM OB Limited   CBC   Urinalysis, Routine w reflex microscopic Urine, Clean Catch   Patient Vitals for the past 24 hrs:  BP Temp Pulse Resp Height Weight  05/09/22 1547 113/69 -- (!) 110 -- -- --  05/09/22 1528 114/71 98.2 F (36.8 C) (!) 112 18 _0  (1.549 m) 68 kg   Results for orders placed or performed during the hospital encounter of 05/09/22 (from the past 24 hour(s))  Urinalysis, Routine w reflex microscopic Urine, Clean Catch     Status: Abnormal   Collection Time: 05/09/22  3:49 PM  Result Value Ref Range   Color, Urine AMBER (A) YELLOW   APPearance CLOUDY (A) CLEAR   Specific Gravity, Urine 1.021 1.005 - 1.030   pH 6.0 5.0 - 8.0   Glucose, UA NEGATIVE NEGATIVE mg/dL   Hgb urine dipstick NEGATIVE NEGATIVE   Bilirubin Urine NEGATIVE NEGATIVE   Ketones, ur 5 (A) NEGATIVE mg/dL   Protein, ur 30 (A) NEGATIVE mg/dL   Nitrite NEGATIVE NEGATIVE   Leukocytes,Ua SMALL (A) NEGATIVE   RBC / HPF 0-5 0 - 5 RBC/hpf   WBC, UA 11-20 0 - 5 WBC/hpf   Bacteria, UA FEW (A) NONE SEEN   Squamous Epithelial / LPF 11-20 0 - 5   Mucus PRESENT   Wet prep, genital     Status: Abnormal   Collection Time: 05/09/22  4:00 PM  Result Value Ref Range   Yeast Wet Prep HPF POC NONE SEEN NONE SEEN   Trich, Wet Prep NONE SEEN NONE SEEN   Clue Cells Wet Prep HPF POC NONE SEEN NONE SEEN   WBC, Wet Prep HPF POC >=10 (A) <10   Sperm NONE SEEN   CBC     Status: Abnormal   Collection Time: 05/09/22  4:05 PM  Result Value Ref Range   WBC 14.6 (H) 4.0 - 10.5 K/uL   RBC 3.72 (L) 3.87 - 5.11 MIL/uL   Hemoglobin 10.4 (L) 12.0 - 15.0 g/dL   HCT 30.1 (L) 36.0 - 46.0 %   MCV 80.9 80.0 - 100.0 fL   MCH 28.0 26.0 - 34.0 pg   MCHC 34.6 30.0 - 36.0 g/dL   RDW 13.7 11.5 - 15.5 %   Platelets 307 150 - 400 K/uL   nRBC 0.0 0.0 - 0.2 %   Meds ordered this encounter  Medications   acetaminophen (TYLENOL) tablet 1,000 mg  cyclobenzaprine (FLEXERIL)  tablet 10 mg   cyclobenzaprine (FLEXERIL) 10 MG tablet    Sig: Take 1 tablet (10 mg total) by mouth 2 (two) times daily as needed for muscle spasms.    Dispense:  20 tablet    Refill:  0    Order Specific Question:   Supervising Provider    Answer:   Janyth Pupa [[id number]]   Assessment and Plan  --21 y.o. [id number] at [redacted]w[redacted]d --Spotting/friable cervix --Hgb 10.4, Blood type B POS --No acute findings on MFM ultrasound --Pain improved s/p PO medication --Care plan coordinated with Dr. EElonda Husky--Discharge home in stable condition  SDarlina Rumpf MWinnetoon MSN, CNM 05/09/2022, 6:06 PM

## 2022-05-09 NOTE — Discharge Instructions (Signed)

## 2022-05-09 NOTE — MAU Note (Signed)
.  Samantha Peters is a 21 y.o. at [redacted]w[redacted]d here in MAU reporting: Increased pelvic pressure and pain. Stated she has felt 2 ctx today and they where painful but the pain is like a sharp pulling pain in her pelvis and back. Good fetal movement reported. Pt also reports she had some bleeding when she went to the [place] earlier today. Denies any recent intercourse or vaginal exams. Pt also stated she has periods of feeling light headed. LMP:  Onset of complaint:  a few days Pain score: 8  Vitals:   05/09/22 1528  BP: 114/71  Pulse: (!) 112  Resp: 18  Temp: 98.2 F (36.8 C)     FHT:142 Lab orders placed from triage:  u/a

## 2022-05-11 LAB — CULTURE, OB URINE: Culture: NO GROWTH

## 2022-05-12 ENCOUNTER — Other Ambulatory Visit (HOSPITAL_COMMUNITY): Payer: Self-pay

## 2022-05-12 ENCOUNTER — Other Ambulatory Visit (HOSPITAL_BASED_OUTPATIENT_CLINIC_OR_DEPARTMENT_OTHER): Payer: Self-pay

## 2022-05-12 LAB — GC/CHLAMYDIA PROBE AMP (~~LOC~~) NOT AT ARMC
Chlamydia: NEGATIVE
Comment: NEGATIVE
Comment: NORMAL
Neisseria Gonorrhea: NEGATIVE

## 2022-05-14 ENCOUNTER — Ambulatory Visit (INDEPENDENT_AMBULATORY_CARE_PROVIDER_SITE_OTHER): Payer: Medicaid Other | Admitting: Family Medicine

## 2022-05-14 VITALS — BP 119/77 | HR 105 | Wt 159.2 lb

## 2022-05-14 DIAGNOSIS — Z3490 Encounter for supervision of normal pregnancy, unspecified, unspecified trimester: Secondary | ICD-10-CM

## 2022-05-14 DIAGNOSIS — Z3A32 32 weeks gestation of pregnancy: Secondary | ICD-10-CM

## 2022-05-14 DIAGNOSIS — O4440 Low lying placenta NOS or without hemorrhage, unspecified trimester: Secondary | ICD-10-CM

## 2022-05-14 DIAGNOSIS — O09899 Supervision of other high risk pregnancies, unspecified trimester: Secondary | ICD-10-CM

## 2022-05-14 DIAGNOSIS — Z2839 Other underimmunization status: Secondary | ICD-10-CM

## 2022-05-14 DIAGNOSIS — O99013 Anemia complicating pregnancy, third trimester: Secondary | ICD-10-CM

## 2022-05-14 NOTE — Progress Notes (Signed)
PRENATAL VISIT NOTE  Subjective:  Samantha Peters is a 21 y.o. G4P2012 at [redacted]w[redacted]d being seen today for ongoing prenatal care.  She is currently monitored for the following issues for this low-risk pregnancy and has Supervision of low-risk pregnancy; Rubella non-immune status, antepartum; Low-lying placenta; Cervical dysplasia; and Anemia complicating pregnancy, third trimester on their problem list.  Patient reports no complaints.  Contractions: Not present. Vag. Bleeding: None.  Movement: Present. Denies leaking of fluid.   The following portions of the patient's history were reviewed and updated as appropriate: allergies, current medications, past family history, past medical history, past social history, past surgical history and problem list.   Objective:   Vitals:   05/14/22 0849  BP: 119/77  Pulse: (!) 105  Weight: 159 lb 3.2 oz (72.2 kg)    Fetal Status: Fetal Heart Rate (bpm): 154 Fundal Height: 32 cm Movement: Present     General:  Alert, oriented and cooperative. Patient is in no acute distress.  Skin: Skin is warm and dry. No rash noted.   Cardiovascular: Normal heart rate noted  Respiratory: Normal respiratory effort, no problems with respiration noted  Abdomen: Soft, gravid, appropriate for gestational age.  Pain/Pressure: Absent     Pelvic: Cervical exam deferred        Extremities: Normal range of motion.  Edema: None  Mental Status: Normal mood and affect. Normal behavior. Normal judgment and thought content.   Assessment and Plan:  Pregnancy: G4P2012 at [redacted]w[redacted]d  1. Encounter for supervision of low-risk pregnancy, antepartum  Centering Pregnancy, Session#4: Reviewed resources in mother's notebook.   Facilitated discussion today: - Breastfeeding popcorn activity - Nursing pain - Pump acquisition-- given edgepark information - ROR Given "Knees and Toes" and discussed importance of reading for brain development and language.   Fundal height and FHR appropriate  today unless noted otherwise in plan. Patient to continue group care.   Elevated PHQ9 but declines LCSW referral today. She reports she is "doing ok"  She was also looking for resources for baby items and I printed her the backbpack beginnings  She took the WB class and will sent certificate via MyChart.  2. Rubella non-immune status, antepartum MMR pp  3. Low-lying placenta RESOLVED  4. Anemia complicating pregnancy, third trimester Lab Results  Component Value Date   HGB 10.4 (L) 05/09/2022     Preterm labor symptoms and general obstetric precautions including but not limited to vaginal bleeding, contractions, leaking of fluid and fetal movement were reviewed in detail with the patient. Please refer to After Visit Summary for other counseling recommendations.   Return in about 2 weeks (around 05/28/2022) for Centering.  Future Appointments  Date Time Provider Department Center  05/28/2022  9:00 AM CENTERING PROVIDER [facility]  05/28/2022 11:15 AM [facility]MC-M[facility] NURSE [facility]  05/28/2022 11:30 AM WMC-MFC US2 [facility]  06/11/2022  9:00 AM CENTERING PROVIDER [facility]  06/25/2022  9:00 AM CENTERING PROVIDER [facility]  07/09/2022  9:00 AM CENTERING PROVIDER [facility]    Kimberly Niles Newton, MD 

## 2022-05-21 ENCOUNTER — Other Ambulatory Visit (HOSPITAL_COMMUNITY): Payer: Self-pay

## 2022-05-28 ENCOUNTER — Ambulatory Visit (INDEPENDENT_AMBULATORY_CARE_PROVIDER_SITE_OTHER): Payer: Medicaid Other | Admitting: Family Medicine

## 2022-05-28 ENCOUNTER — Ambulatory Visit: Payer: Medicaid Other | Attending: Obstetrics and Gynecology

## 2022-05-28 ENCOUNTER — Ambulatory Visit: Payer: Medicaid Other | Admitting: *Deleted

## 2022-05-28 ENCOUNTER — Encounter: Payer: Self-pay | Admitting: *Deleted

## 2022-05-28 VITALS — BP 131/73 | HR 94

## 2022-05-28 VITALS — BP 116/80 | HR 102 | Wt 160.8 lb

## 2022-05-28 DIAGNOSIS — Z3493 Encounter for supervision of normal pregnancy, unspecified, third trimester: Secondary | ICD-10-CM

## 2022-05-28 DIAGNOSIS — Z3689 Encounter for other specified antenatal screening: Secondary | ICD-10-CM | POA: Insufficient documentation

## 2022-05-28 DIAGNOSIS — O09899 Supervision of other high risk pregnancies, unspecified trimester: Secondary | ICD-10-CM

## 2022-05-28 DIAGNOSIS — Z362 Encounter for other antenatal screening follow-up: Secondary | ICD-10-CM

## 2022-05-28 DIAGNOSIS — O99213 Obesity complicating pregnancy, third trimester: Secondary | ICD-10-CM | POA: Diagnosis not present

## 2022-05-28 DIAGNOSIS — Z3A34 34 weeks gestation of pregnancy: Secondary | ICD-10-CM

## 2022-05-28 DIAGNOSIS — E669 Obesity, unspecified: Secondary | ICD-10-CM | POA: Diagnosis not present

## 2022-05-28 DIAGNOSIS — O99212 Obesity complicating pregnancy, second trimester: Secondary | ICD-10-CM | POA: Diagnosis present

## 2022-05-28 DIAGNOSIS — Z2839 Other underimmunization status: Secondary | ICD-10-CM

## 2022-05-28 DIAGNOSIS — O4443 Low lying placenta NOS or without hemorrhage, third trimester: Secondary | ICD-10-CM

## 2022-05-28 DIAGNOSIS — O09893 Supervision of other high risk pregnancies, third trimester: Secondary | ICD-10-CM

## 2022-05-28 DIAGNOSIS — O99013 Anemia complicating pregnancy, third trimester: Secondary | ICD-10-CM

## 2022-05-28 DIAGNOSIS — O4440 Low lying placenta NOS or without hemorrhage, unspecified trimester: Secondary | ICD-10-CM

## 2022-05-28 NOTE — Progress Notes (Signed)
   PRENATAL VISIT NOTE  Subjective:  Samantha Peters is a 21 y.o. Q0H4742 at [redacted]w[redacted]d being seen today for ongoing prenatal care.  She is currently monitored for the following issues for this low-risk pregnancy and has Supervision of low-risk pregnancy; Rubella non-immune status, antepartum; Low-lying placenta; Cervical dysplasia; and Anemia complicating pregnancy, third trimester on their problem list.  Patient reports no complaints.  Contractions: Not present. Vag. Bleeding: None.  Movement: Present. Denies leaking of fluid.   The following portions of the patient's history were reviewed and updated as appropriate: allergies, current medications, past family history, past medical history, past social history, past surgical history and problem list.   Objective:   Vitals:   05/28/22 1327  BP: 116/80  Pulse: (!) 102  Weight: 160 lb 12.8 oz (72.9 kg)    Fetal Status: Fetal Heart Rate (bpm): 135 Fundal Height: 35 cm Movement: Present  Presentation: Vertex  General:  Alert, oriented and cooperative. Patient is in no acute distress.  Skin: Skin is warm and dry. No rash noted.   Cardiovascular: Normal heart rate noted  Respiratory: Normal respiratory effort, no problems with respiration noted  Abdomen: Soft, gravid, appropriate for gestational age.  Pain/Pressure: Present     Pelvic: Cervical exam deferred        Extremities: Normal range of motion.  Edema: Trace  Mental Status: Normal mood and affect. Normal behavior. Normal judgment and thought content.   Assessment and Plan:  Pregnancy: V9D6387 at [redacted]w[redacted]d 1. Anemia complicating pregnancy, third trimester Last HGB Lab Results  Component Value Date   HGB 10.4 (L) 05/09/2022   Continue Fe oral until after delivery  2. Low-lying placenta Resolved 10/11  3. Encounter for supervision of low-risk pregnancy in third trimester Up to date No Concerns Has WB certificate today- completed class Declined BH referral in the past, continue to  address  Participants in group that were interested in [facility] stayed after the session to discuss WB risk out process.  - Pt is interested in waterbirth.  No contraindications at this time per chart review/patient assessment.   - Pt completed class in October - Discussed waterbirth as option for low-risk pregnancy.  Reviewed conditions that may arise during pregnancy that will risk pt out of waterbirth including hypertension, diabetes, fetal growth restriction <10%ile, etc.    Centering Pregnancy, Session#5: Reviewed resources in [employer].   Facilitated discussion today:  Stages of Labor, Sign of labor, labor positions, coping strategies.   Fundal height and FHR appropriate today unless noted otherwise in plan. Patient to continue group care.   4. Rubella non-immune status, antepartum MM PP  Preterm labor symptoms and general obstetric precautions including but not limited to vaginal bleeding, contractions, leaking of fluid and fetal movement were reviewed in detail with the patient. Please refer to After Visit Summary for other counseling recommendations.   Return in about 2 weeks (around 06/11/2022) for Centering.  Future Appointments  Date Time Provider Department Center  06/11/2022  9:00 AM CENTERING PROVIDER [facility]  06/25/2022  9:00 AM CENTERING PROVIDER [facility]  07/09/2022  9:00 AM CENTERING PROVIDER [facility]    Federico Flake, MD

## 2022-06-02 NOTE — Patient Instructions (Signed)
DOULA LIST   [facility]  Sierra Bizzell  [phone]  [email or web address]  [email or web address]  [facility] Doula Zula Price [phone]  [email or web address]   [facility]   Precious J. Bradley   [email or web address]   ??THE MOTHERLY DOULA?? Serenna Dawson   [phone]   [email or web address]     [facility]  Evelyn Tinsley  [phone]    [email or web address]   [facility]  Angie Rosier     [phone]     [email or web address]   Rachel McMillen: Doula & Photographer   Rachel McMillen [phone]       [email or web address]  [email or web address]   [facility]  Amelia Mattocks [phone]   [email or web address]   [facility]  Tiffany Slade  [phone]  [email or web address]   [email or web address]   [facility]   Iris Jones   [phone]  [email or web address]   Mary Walt Sawyer Doula  Mary Walt  [phone] [email or web address]  [facility]  Jessica Bower           Sarah Carter         Christina Flaherty       Lora Reynolds     [phone] [email or web address]  [email or web address]   [facility]   Ciara Foxx [phone] [email or web address]   [facility]  Robin StJohn     [phone]  [email or web address]  [facility]ole[facility] Doula  Jaden Millner     [phone]  [email or web address]  Facebook and IG [email or web address]   Bernadette Vereen  [phone] [email or web address]    Breanna Grant [phone] [email or web address]   Melissa Luck  [phone] [email or web address]     Madison Manson  [phone] [email or web address]   IG [email or web address]   Cierra Moore    [phone]  [email or web address]   Jerilyn "Jeri" Free  [phone] [email or web address]    Mtende Roll  [phone] [email or web address]   Susie Williams   [email or web address]    Liz Chavez    [phone] [email or web address]     Jessica Ayivi  [phone] [email or web address]    Zarmena Woods  [phone] [email or web address]    Shayla Rhem    [phone]   [facility] Joie Morrison  [phone] [email or web address]  Doula Mama Kathryn Farrar [phone] [email or web address]  [facility] Joie Morrison  [phone] [email or web address]  [facility]      Beth Ann Martin [phone]  [email or web address]  [email or web address]   ShawnTina Harris-Jones  [phone] [email or web address]   Sharyn Gietzen [phone] [email or web address]   Carlee Henry [phone] [email or web address]   Leatrice Priest  [phone] [email or web address]  [employer]  Terry Anderson  [phone] [email or web address]   Leslie King [phone] [email or web address]  MOOR Divine Myeka Dunn  [email or web address]   T-sheana Turner [phone] [email or web address]   Maya [name] [phone] [email or web address]   Juante Randleman [phone] [email or web address]         

## 2022-06-10 NOTE — Progress Notes (Signed)
DNKA

## 2022-06-11 ENCOUNTER — Ambulatory Visit (INDEPENDENT_AMBULATORY_CARE_PROVIDER_SITE_OTHER): Payer: Medicaid Other | Admitting: Advanced Practice Midwife

## 2022-06-11 ENCOUNTER — Encounter: Payer: Self-pay | Admitting: *Deleted

## 2022-06-11 DIAGNOSIS — Z3493 Encounter for supervision of normal pregnancy, unspecified, third trimester: Secondary | ICD-10-CM

## 2022-06-11 DIAGNOSIS — Z2839 Other underimmunization status: Secondary | ICD-10-CM

## 2022-06-11 DIAGNOSIS — O09899 Supervision of other high risk pregnancies, unspecified trimester: Secondary | ICD-10-CM

## 2022-06-16 ENCOUNTER — Encounter (HOSPITAL_COMMUNITY): Payer: Self-pay | Admitting: Obstetrics and Gynecology

## 2022-06-16 ENCOUNTER — Inpatient Hospital Stay (HOSPITAL_COMMUNITY)
Admission: AD | Admit: 2022-06-16 | Discharge: 2022-06-16 | Disposition: A | Discharge status: Home / Self Care | Payer: Medicaid Other | Attending: Obstetrics and Gynecology | Admitting: Obstetrics and Gynecology

## 2022-06-16 DIAGNOSIS — Z3493 Encounter for supervision of normal pregnancy, unspecified, third trimester: Secondary | ICD-10-CM

## 2022-06-16 DIAGNOSIS — R102 Pelvic and perineal pain: Secondary | ICD-10-CM | POA: Diagnosis present

## 2022-06-16 DIAGNOSIS — Z3689 Encounter for other specified antenatal screening: Secondary | ICD-10-CM | POA: Insufficient documentation

## 2022-06-16 DIAGNOSIS — Z3A36 36 weeks gestation of pregnancy: Secondary | ICD-10-CM | POA: Insufficient documentation

## 2022-06-16 DIAGNOSIS — O26893 Other specified pregnancy related conditions, third trimester: Secondary | ICD-10-CM | POA: Diagnosis present

## 2022-06-16 LAB — URINALYSIS, ROUTINE W REFLEX MICROSCOPIC
Bilirubin Urine: NEGATIVE
Glucose, UA: NEGATIVE mg/dL
Hgb urine dipstick: NEGATIVE
Ketones, ur: NEGATIVE mg/dL
Leukocytes,Ua: NEGATIVE
Nitrite: NEGATIVE
Protein, ur: NEGATIVE mg/dL
Specific Gravity, Urine: >1.03 — ABNORMAL HIGH (ref 1.005–1.030)
pH: 6 (ref 5.0–8.0)

## 2022-06-16 MED ORDER — CYCLOBENZAPRINE HCL 5 MG PO TABS
10.0000 mg | ORAL_TABLET | Freq: Once | ORAL | Status: AC
  Administered 2022-06-16: 10 mg via ORAL
  Filled 2022-06-16: qty 2

## 2022-06-16 MED ORDER — CYCLOBENZAPRINE HCL 10 MG PO TABS
10.0000 mg | ORAL_TABLET | Freq: Two times a day (BID) | ORAL | 0 refills | Status: AC | PRN
  Filled 2022-06-16: qty 60, 30d supply, fill #0

## 2022-06-16 MED ORDER — ACETAMINOPHEN 500 MG PO TABS
1000.0000 mg | ORAL_TABLET | Freq: Once | ORAL | Status: AC
  Administered 2022-06-16: 1000 mg via ORAL
  Filled 2022-06-16: qty 2

## 2022-06-16 NOTE — MAU Note (Signed)
I have communicated with Thalia Bloodgood CNM and reviewed vital signs:  Vitals:   06/16/22 1950 06/16/22 2200  BP: (!) 102/51 110/69  Pulse: 80 88  Resp: 17 17  Temp:    SpO2: 100% 100%    Vaginal exam:  Dilation: 2 Effacement (%): Thick Cervical Position: Anterior Station: -2 Presentation: Vertex Exam by:: Ginnie Smart RN,   Also reviewed contraction pattern and that non-stress test is reactive.  It has been documented that patient is contracting irregular mild contractions without cervical change over 2 hours not indicating active labor.  Patient denies any other complaints.  Based on this report provider has given order for discharge.  A discharge order and diagnosis entered by a provider.   Labor discharge instructions reviewed with patient.

## 2022-06-16 NOTE — MAU Note (Signed)
Samantha Peters is a 21 y.o. at 109w5d here in MAU reporting: spotting started last night, none today.  More mucous than anything.  Started cramping in lower abd, coming every 30-45 min.  Very hard to walk. Constant pain in lower back for past couple days, worse now. No LOF, reports +FM Onset of complaint: back pain started 2 nights ago.  Cramping started 1400 Pain score: abd 8, back 8 Vitals:   06/16/22 1907  BP: 121/79  Pulse: 81  Resp: 18  Temp: 98 F (36.7 C)  SpO2: 100%     FHT:147 Lab orders placed from triage:  urine

## 2022-06-16 NOTE — MAU Provider Note (Signed)
Event Date/Time   First Provider Initiated Contact with Patient 06/16/22 2021     S: Ms. Samantha Peters is a 21 y.o. [phone] at [redacted]w[redacted]d  who presents to MAU today complaining contractions q 30-45 min throughout the afternoon today. She endorses vaginal spotting. She denies LOF. She reports normal fetal movement.    Patient reports band of pelvic pain, onset at 8 months of pregnancy. She reports to CNM "I can feel that I'm getting close to my due date" based on her increasing level of discomfort.  O: BP (!) 102/51 (BP Location: Right Arm)   Pulse 80   Temp 98 F (36.7 C) (Oral)   Resp 17   Ht 5\' 1"  (1.549 m)   Wt 73.7 kg   LMP 10/02/2021 (Within Days)   SpO2 100%   BMI 30.70 kg/m  GENERAL: Well-developed, well-nourished female in no acute distress.  HEAD: Normocephalic, atraumatic.  CHEST: Normal effort of breathing, regular heart rate ABDOMEN: Soft, nontender, gravid  Cervical exam:  Dilation: 1 Effacement (%): Thick Cervical Position: Anterior Station: -3 Presentation: Vertex Exam by:: [email or web address] RN   Fetal Monitoring: Baseline: 140 Variability: Mod Accelerations: 15 x 15 Decelerations: N/A Contractions: Irregular q 2-4 min   A: SIUP at [redacted]w[redacted]d  Cat I tracing Minimal cervical change from 1-2cm in 90 min, cervix remains thick CNM at bedside to discuss contraction pain and intervals vs recurrent pelvic pain  P: Discharge home in stable condition with early labor precautions  Meds ordered this encounter  Medications   acetaminophen (TYLENOL) tablet 1,000 mg   cyclobenzaprine (FLEXERIL) tablet 10 mg   cyclobenzaprine (FLEXERIL) 10 MG tablet    Sig: Take 1 tablet (10 mg total) by mouth 2 (two) times daily as needed for muscle spasms.    Dispense:  60 tablet    Refill:  0    Order Specific Question:   Supervising Provider    Answer:   [redacted]w[redacted]d Myna Hidalgo    [[id number]], MSA, MSN, CNM

## 2022-06-17 ENCOUNTER — Other Ambulatory Visit (HOSPITAL_COMMUNITY): Payer: Self-pay

## 2022-06-18 NOTE — Progress Notes (Unsigned)
   PRENATAL VISIT NOTE  Subjective:  Samantha Peters is a 21 y.o. [id number] at 36w1dbeing seen today for ongoing prenatal care.  She is currently monitored for the following issues for this low-risk pregnancy and has Supervision of low-risk pregnancy; Rubella non-immune status, antepartum; Low-lying placenta; Cervical dysplasia; and Anemia complicating pregnancy, third trimester on their problem list.  Patient reports {sx:14538}.   .  .   . Denies leaking of fluid.   The following portions of the patient's history were reviewed and updated as appropriate: allergies, current medications, past family history, past medical history, past social history, past surgical history and problem list.   Objective:  There were no vitals filed for this visit.  Fetal Status:           General:  Alert, oriented and cooperative. Patient is in no acute distress.  Skin: Skin is warm and dry. No rash noted.   Cardiovascular: Normal heart rate noted  Respiratory: Normal respiratory effort, no problems with respiration noted  Abdomen: Soft, gravid, appropriate for gestational age.        Pelvic: {Blank single:19197::"Cervical exam performed in the presence of a chaperone","Cervical exam deferred"}        Extremities: Normal range of motion.     Mental Status: Normal mood and affect. Normal behavior. Normal judgment and thought content.   Assessment and Plan:  Pregnancy: [id number] 347w0d. Encounter for supervision of low-risk pregnancy in third trimester - GBS and cultures  2. Rubella non-immune status, antepartum - MMR PP  3. [redacted] weeks gestation of pregnancy - GBS and cultures  Term labor symptoms and general obstetric precautions including but not limited to vaginal bleeding, contractions, leaking of fluid and fetal movement were reviewed in detail with the patient. Please refer to After Visit Summary for other counseling recommendations.   No follow-ups on file.  Future Appointments  Date Time  Provider [place]11/30/2023 10:15 AM [facility]12/12/2021  9:00 AM CENTERING PROVIDER [facility]12/13/2023  3:35 PM BaGriffin BasilMD [facility]12/20/2023  9:00 AM CENTERING PROVIDER [facility]

## 2022-06-19 ENCOUNTER — Encounter: Payer: Self-pay | Admitting: Family Medicine

## 2022-06-19 ENCOUNTER — Ambulatory Visit (INDEPENDENT_AMBULATORY_CARE_PROVIDER_SITE_OTHER): Payer: Medicaid Other | Admitting: Advanced Practice Midwife

## 2022-06-19 ENCOUNTER — Other Ambulatory Visit (HOSPITAL_COMMUNITY)
Admission: RE | Admit: 2022-06-19 | Discharge: 2022-06-19 | Disposition: A | Discharge status: Home / Self Care | Payer: Medicaid Other | Source: Ambulatory Visit | Attending: Obstetrics & Gynecology | Admitting: Obstetrics & Gynecology

## 2022-06-19 ENCOUNTER — Other Ambulatory Visit: Payer: Self-pay

## 2022-06-19 ENCOUNTER — Observation Stay (HOSPITAL_COMMUNITY)
Admission: AD | Admit: 2022-06-19 | Discharge: 2022-06-20 | DRG: 832 | Disposition: A | Discharge status: Home / Self Care | Payer: Medicaid Other | Attending: Obstetrics & Gynecology | Admitting: Obstetrics & Gynecology

## 2022-06-19 ENCOUNTER — Encounter (HOSPITAL_COMMUNITY): Payer: Self-pay | Admitting: Obstetrics & Gynecology

## 2022-06-19 VITALS — BP 119/82 | HR 93 | Wt 162.9 lb

## 2022-06-19 DIAGNOSIS — Z2839 Other underimmunization status: Secondary | ICD-10-CM

## 2022-06-19 DIAGNOSIS — O99891 Other specified diseases and conditions complicating pregnancy: Secondary | ICD-10-CM | POA: Diagnosis not present

## 2022-06-19 DIAGNOSIS — Z8379 Family history of other diseases of the digestive system: Secondary | ICD-10-CM

## 2022-06-19 DIAGNOSIS — Z3A37 37 weeks gestation of pregnancy: Secondary | ICD-10-CM | POA: Diagnosis not present

## 2022-06-19 DIAGNOSIS — Z87898 Personal history of other specified conditions: Secondary | ICD-10-CM | POA: Diagnosis not present

## 2022-06-19 DIAGNOSIS — O471 False labor at or after 37 completed weeks of gestation: Principal | ICD-10-CM | POA: Diagnosis present

## 2022-06-19 DIAGNOSIS — Z841 Family history of disorders of kidney and ureter: Secondary | ICD-10-CM

## 2022-06-19 DIAGNOSIS — Z3493 Encounter for supervision of normal pregnancy, unspecified, third trimester: Secondary | ICD-10-CM

## 2022-06-19 DIAGNOSIS — Z5941 Food insecurity: Secondary | ICD-10-CM

## 2022-06-19 DIAGNOSIS — Z8719 Personal history of other diseases of the digestive system: Secondary | ICD-10-CM | POA: Diagnosis not present

## 2022-06-19 DIAGNOSIS — Z833 Family history of diabetes mellitus: Secondary | ICD-10-CM

## 2022-06-19 DIAGNOSIS — N858 Other specified noninflammatory disorders of uterus: Secondary | ICD-10-CM | POA: Diagnosis present

## 2022-06-19 DIAGNOSIS — O4443 Low lying placenta NOS or without hemorrhage, third trimester: Secondary | ICD-10-CM | POA: Diagnosis not present

## 2022-06-19 DIAGNOSIS — O99013 Anemia complicating pregnancy, third trimester: Secondary | ICD-10-CM | POA: Diagnosis present

## 2022-06-19 DIAGNOSIS — Z79899 Other long term (current) drug therapy: Secondary | ICD-10-CM

## 2022-06-19 DIAGNOSIS — Z8249 Family history of ischemic heart disease and other diseases of the circulatory system: Secondary | ICD-10-CM

## 2022-06-19 DIAGNOSIS — D649 Anemia, unspecified: Secondary | ICD-10-CM | POA: Diagnosis not present

## 2022-06-19 DIAGNOSIS — Z28310 Unvaccinated for covid-19: Secondary | ICD-10-CM | POA: Diagnosis not present

## 2022-06-19 DIAGNOSIS — Z8709 Personal history of other diseases of the respiratory system: Secondary | ICD-10-CM | POA: Diagnosis not present

## 2022-06-19 DIAGNOSIS — N879 Dysplasia of cervix uteri, unspecified: Secondary | ICD-10-CM | POA: Diagnosis present

## 2022-06-19 DIAGNOSIS — Z5982 Transportation insecurity: Secondary | ICD-10-CM | POA: Diagnosis not present

## 2022-06-19 DIAGNOSIS — O09899 Supervision of other high risk pregnancies, unspecified trimester: Secondary | ICD-10-CM

## 2022-06-19 DIAGNOSIS — O479 False labor, unspecified: Principal | ICD-10-CM | POA: Diagnosis present

## 2022-06-19 DIAGNOSIS — Z87891 Personal history of nicotine dependence: Secondary | ICD-10-CM

## 2022-06-19 NOTE — Patient Instructions (Signed)
Braxton Hicks Contractions (English)  Nurse, mental health  New Communication Letters:      Let's get sending!Start a Chartered loss adjuster.  Method of Visit    Flowsheets Video Visit from 12/26/2021 in Center for [facility] Routine Prenatal from 04/01/2022 in [facility] Routine Prenatal from 06/19/2022 in [facility] at [facility] for [facility] 12/26/2021 04/01/2022 06/19/2022 1202 1112 1128 Visit Method Type of Visit  Not In-person Type of Visit. Not In-person. The comment is MyChart video visit. Taken on 12/26/21 1202 In-person Associate Professor for new charge  Add  My Favorites

## 2022-06-19 NOTE — MAU Note (Signed)
.  Samantha Peters is a 21 y.o. at [redacted]w[redacted]d here in MAU reporting: CTX that have been more frequent since 1100 today. Pt reports had a office visit today and SVE was 3cm. Pt denies VB, LOF, DFM, abnormal discharge, PIH s/s, recent intercourse, and complications in the pregnancy.  Desires Waterbirth GBS ukn Onset of complaint: 1100 Pain score: 8/10 Vitals:   06/19/22 2146  BP: 116/75  Pulse: (!) 115  Resp: 18  Temp: 98.6 F (37 C)  SpO2: 99%     FHT:145 Lab orders placed from triage:

## 2022-06-20 ENCOUNTER — Inpatient Hospital Stay (HOSPITAL_COMMUNITY): Payer: Medicaid Other | Admitting: Anesthesiology

## 2022-06-20 ENCOUNTER — Encounter: Payer: Medicaid Other | Admitting: Obstetrics & Gynecology

## 2022-06-20 ENCOUNTER — Telehealth: Payer: Self-pay

## 2022-06-20 ENCOUNTER — Other Ambulatory Visit: Payer: Self-pay

## 2022-06-20 ENCOUNTER — Encounter (HOSPITAL_COMMUNITY): Payer: Self-pay | Admitting: Obstetrics & Gynecology

## 2022-06-20 DIAGNOSIS — O479 False labor, unspecified: Principal | ICD-10-CM | POA: Diagnosis present

## 2022-06-20 LAB — CBC
HCT: 28.2 % — ABNORMAL LOW (ref 36.0–46.0)
Hemoglobin: 9.4 g/dL — ABNORMAL LOW (ref 12.0–15.0)
MCH: 26.9 pg (ref 26.0–34.0)
MCHC: 33.3 g/dL (ref 30.0–36.0)
MCV: 80.8 fL (ref 80.0–100.0)
Platelets: 296 10*3/uL (ref 150–400)
RBC: 3.49 MIL/uL — ABNORMAL LOW (ref 3.87–5.11)
RDW: 13.7 % (ref 11.5–15.5)
WBC: 13.8 10*3/uL — ABNORMAL HIGH (ref 4.0–10.5)
nRBC: 0 % (ref 0.0–0.2)

## 2022-06-20 LAB — GC/CHLAMYDIA PROBE AMP (~~LOC~~) NOT AT ARMC
Chlamydia: NEGATIVE
Comment: NEGATIVE
Comment: NORMAL
Neisseria Gonorrhea: NEGATIVE

## 2022-06-20 LAB — TYPE AND SCREEN
ABO/RH(D): B POS
Antibody Screen: NEGATIVE

## 2022-06-20 LAB — RPR: RPR Ser Ql: NONREACTIVE

## 2022-06-20 LAB — GROUP B STREP BY PCR: Group B strep by PCR: NEGATIVE

## 2022-06-20 MED ORDER — OXYTOCIN-SODIUM CHLORIDE 30-0.9 UT/500ML-% IV SOLN: 2.5000 [IU]/h | INTRAVENOUS | Status: DC

## 2022-06-20 MED ORDER — LACTATED RINGERS IV SOLN
500.0000 mL | INTRAVENOUS | Status: DC | PRN
  Administered 2022-06-20: 1000 mL via INTRAVENOUS

## 2022-06-20 MED ORDER — ACETAMINOPHEN 500 MG PO TABS
1000.0000 mg | ORAL_TABLET | Freq: Once | ORAL | Status: AC
  Administered 2022-06-20: 1000 mg via ORAL
  Filled 2022-06-20: qty 2

## 2022-06-20 MED ORDER — LIDOCAINE HCL (PF) 1 % IJ SOLN
INTRAMUSCULAR | Status: DC | PRN
  Administered 2022-06-20: 5 mL via EPIDURAL
  Administered 2022-06-20: 3 mL via EPIDURAL

## 2022-06-20 MED ORDER — DIPHENHYDRAMINE HCL 50 MG/ML IJ SOLN
12.5000 mg | INTRAMUSCULAR | Status: DC | PRN
  Administered 2022-06-20: 12.5 mg via INTRAVENOUS
  Filled 2022-06-20: qty 1

## 2022-06-20 MED ORDER — SOD CITRATE-CITRIC ACID 500-334 MG/5ML PO SOLN: 30.0000 mL | ORAL | Status: DC | PRN

## 2022-06-20 MED ORDER — FENTANYL CITRATE (PF) 100 MCG/2ML IJ SOLN
50.0000 ug | INTRAMUSCULAR | Status: DC | PRN
  Administered 2022-06-20: 100 ug via INTRAVENOUS
  Administered 2022-06-20 (×2): 50 ug via INTRAVENOUS
  Filled 2022-06-20 (×3): qty 2

## 2022-06-20 MED ORDER — PHENYLEPHRINE 80 MCG/ML (10ML) SYRINGE FOR IV PUSH (FOR BLOOD PRESSURE SUPPORT)
80.0000 ug | PREFILLED_SYRINGE | INTRAVENOUS | Status: DC | PRN
  Filled 2022-06-20: qty 10

## 2022-06-20 MED ORDER — PHENYLEPHRINE 80 MCG/ML (10ML) SYRINGE FOR IV PUSH (FOR BLOOD PRESSURE SUPPORT): 80.0000 ug | PREFILLED_SYRINGE | INTRAVENOUS | Status: DC | PRN

## 2022-06-20 MED ORDER — ONDANSETRON HCL 4 MG/2ML IJ SOLN: 4.0000 mg | Freq: Four times a day (QID) | INTRAMUSCULAR | Status: DC | PRN

## 2022-06-20 MED ORDER — OXYCODONE-ACETAMINOPHEN 5-325 MG PO TABS: 1.0000 | ORAL_TABLET | ORAL | Status: DC | PRN

## 2022-06-20 MED ORDER — EPHEDRINE 5 MG/ML INJ: 10.0000 mg | INTRAVENOUS | Status: DC | PRN

## 2022-06-20 MED ORDER — LACTATED RINGERS IV SOLN
500.0000 mL | Freq: Once | INTRAVENOUS | Status: AC
  Administered 2022-06-20: 500 mL via INTRAVENOUS

## 2022-06-20 MED ORDER — SODIUM CHLORIDE 0.9 % IV SOLN
200.0000 mg | Freq: Once | INTRAVENOUS | Status: AC
  Administered 2022-06-20: 200 mg via INTRAVENOUS
  Filled 2022-06-20: qty 10

## 2022-06-20 MED ORDER — ACETAMINOPHEN 325 MG PO TABS: 650.0000 mg | ORAL_TABLET | ORAL | Status: DC | PRN

## 2022-06-20 MED ORDER — LACTATED RINGERS IV SOLN: INTRAVENOUS | Status: DC

## 2022-06-20 MED ORDER — LIDOCAINE HCL (PF) 1 % IJ SOLN: 30.0000 mL | INTRAMUSCULAR | Status: DC | PRN

## 2022-06-20 MED ORDER — FENTANYL-BUPIVACAINE-NACL 0.5-0.125-0.9 MG/250ML-% EP SOLN
12.0000 mL/h | EPIDURAL | Status: DC | PRN
  Administered 2022-06-20: 12 mL/h via EPIDURAL
  Filled 2022-06-20: qty 250

## 2022-06-20 MED ORDER — OXYCODONE-ACETAMINOPHEN 5-325 MG PO TABS: 2.0000 | ORAL_TABLET | ORAL | Status: DC | PRN

## 2022-06-20 MED ORDER — OXYTOCIN BOLUS FROM INFUSION: 333.0000 mL | Freq: Once | INTRAVENOUS | Status: DC

## 2022-06-20 NOTE — Progress Notes (Signed)
Samantha Peters is a 21 y.o. [phone] at [redacted]w[redacted]d  Subjective: Resting in bed, talking to family and friends on phone  Objective: BP 104/64   Pulse (!) 123   Temp 97.8 F (36.6 C) (Oral)   Resp 16   Ht 5\' 1"  (1.549 m)   Wt 73.8 kg   LMP 10/02/2021 (Within Days)   SpO2 95%   BMI 30.76 kg/m  No intake/output data recorded. No intake/output data recorded.  FHT:  FHR: 135 bpm, variability: moderate,  accelerations:  Present,  decelerations:  Absent UC:   occasional SVE:   Dilation: 5.5 Effacement (%): Thick Station: -1 Exam by:: sam Samantha Peters, cnm  Labs: Lab Results  Component Value Date   WBC 13.8 (H) 06/20/2022   HGB 9.4 (L) 06/20/2022   HCT 28.2 (L) 06/20/2022   MCV 80.8 06/20/2022   PLT 296 06/20/2022    Assessment / Plan: --Call received from Dr. 14/07/2021, ok to offer IV Iron prior to discharge --Reviewed options for IV iron, infusion time vs occurrence of side effects for Feraheme vs Venofer --Patient agreeable to half dose Venofer, order placed, CNM called Pharmacy to confirm intentionally smaller dose --Anticipate discharge home with completion of IV Iron unless patient experiences recurrence of contractions  Ashok Pall, CNM 06/20/2022, 12:31 PM

## 2022-06-20 NOTE — H&P (Addendum)
OBSTETRIC ADMISSION HISTORY AND PHYSICAL  Samantha Peters is a 21 y.o. female [phone] with IUP at 83w2dby LMP c/w 9wk [place]ing for expectant management of labor.   Patient was initially interested in waterbirth, but she is now concerned about the potential pain with labor and requests an epidural. Feeling some discomfort in her back. She reports +FMs, No LOF, no blurry vision, headaches or peripheral edema, and RUQ pain.  Some bloody show. She plans on breast and bottle feeding. She request depo for birth control. [facility]  Dating: By LMP c/w 9wk [place]--->  Estimated Date of Delivery: 07/09/22  Sono:    _0 , CWD, normal anatomy, cephalic presentation, anterior placenta, 2044g, 13% EFW   Prenatal History/Complications:  Patient Active Problem List   Diagnosis Date Noted   Anemia complicating pregnancy, third trimester 04/16/2022   Cervical dysplasia 02/27/2022   Low-lying placenta 02/13/2022   Rubella non-immune status, antepartum 12/28/2021   Supervision of low-risk pregnancy 12/26/2021        Nursing Staff Provider  Office Location  [facility] Dating  LMP c/w 9wk [place] PNC Model _1  Traditional _2  Centering _3  Mom-Baby Dyad      Language   English Anatomy [place]  LL placenta-Resolved  Flu Vaccine  04/16/22 Genetic/Carrier Screen  NIPS:   Low risk female AFP:   Past time Horizon: not performed  TDaP Vaccine   04/16/22 Hgb A1C or  GTT Early 5.4 Third trimester Nml  COVID Vaccine  no   LAB RESULTS   Rhogam  B+ Blood Type B/Positive/-- (06/09 1057) B pos  Baby Feeding Plan  Bottle  Antibody Negative (06/09 1057)neg  Contraception  Depo  Rubella <0.90 (06/09 1057)non imm  Circumcision  N/A RPR Non Reactive (06/09 1057) neg  Pediatrician   City Block HBsAg Negative (06/09 1057) neg  Support Person  FOB , Mom HCVAb neg  Prenatal Classes Info given HIV Non Reactive (06/09 1057)   neg  BTL Consent   GBS (For PCN allergy, check sensitivities)   VBAC  Consent  N/A Pap  ASCUS, HPV+, rpt pap 02/2023           DME Rx [x ] BP cuff [x ] Weight Scale Waterbirth  _4  Class _5  Consent _6  CNM visit  PHQ9 & GAD7 _7  new OB - 10/13 [Valu.Nieves] 28 weeks  [  ] 36 weeks Induction  _8  Orders Entered _9 Foley Y/N     Past Medical History: Past Medical History:  Diagnosis Date   Bronchitis    Elective abortion    History of umbilical hernia [id number]  Medical history non-contributory     Past Surgical History: Past Surgical History:  Procedure Laterality Date   HERNIA REPAIR      Obstetrical History: OB History     Gravida  4   Para  2   Term  2   Preterm  0   AB  1   Living  2      SAB  0   IAB  1   Ectopic      Multiple      Live Births  2           Social History Social History   Socioeconomic History   Marital status: Single    Spouse name: Not on file   Number of children: Not on file   Years of education: Not on  file   Highest education level: Not on file  Occupational History   Not on file  Tobacco Use   Smoking status: Former    Types: Cigarettes, Cigars    Quit date: 11/06/2021    Years since quitting: 0.6   Smokeless tobacco: Never  Vaping Use   Vaping Use: Former   Quit date: 08/27/2021   Substances: Flavoring  Substance and Sexual Activity   Alcohol use: No   Drug use: No   Sexual activity: Not Currently    Birth control/protection: None  Other Topics Concern   Not on file  Social History Narrative   Not on file   Social Determinants of Health   Financial Resource Strain: Not on file  Food Insecurity: Food Insecurity Present (05/14/2022)   Hunger Vital Sign    Worried About Running Out of Food in the Last Year: Sometimes true    Ran Out of Food in the Last Year: Sometimes true  Transportation Needs: Unmet Transportation Needs (05/14/2022)   PRAPARE - Hydrologist (Medical): Yes    Lack of Transportation (Non-Medical): Yes  Physical Activity: Not on file   Stress: Not on file  Social Connections: Not on file    Family History: Family History  Problem Relation Age of Onset   Diabetes Mother    Hypertension Mother    Heart failure Mother    Kidney disease Mother    Hernia Father     Allergies: No Known Allergies  Medications Prior to Admission  Medication Sig Dispense Refill Last Dose   Prenatal MV & Min w/FA-DHA (PRENATAL GUMMIES PO) Take 1 tablet by mouth daily.   06/19/2022   Blood Pressure Monitoring (BLOOD PRESSURE KIT) DEVI 1 Device by Does not apply route as needed. 1 each 0    cetirizine (ZYRTEC) 10 MG tablet Take 1 tablet (10 mg total) by mouth daily. (Patient not taking: Reported on 05/14/2022) 30 tablet 1    cyclobenzaprine (FLEXERIL) 10 MG tablet Take 1 tablet (10 mg total) by mouth 2 (two) times daily as needed for muscle spasms. 60 tablet 0    ferrous sulfate (FERROUSUL) 325 (65 FE) MG tablet Take 1 tablet (325 mg total) by mouth every other day. (Patient not taking: Reported on 05/28/2022) 60 tablet 1    Misc. Devices (GOJJI WEIGHT SCALE) MISC 1 Device by Does not apply route as needed. 1 each 0      Review of Systems   All systems reviewed and negative except as stated in HPI  Blood pressure 121/74, pulse (!) 109, temperature 98.6 F (37 C), temperature source Oral, resp. rate 18, height _0  (1.549 m), weight 73.8 kg, last menstrual period 10/02/2021, SpO2 99 %, unknown if currently breastfeeding. General appearance: alert, cooperative, and no distress Lungs: easy work of breathing Abdomen: soft, non-tender; gravid Extremities: Homans sign is negative, no sign of DVT, no edema  Presentation: cephalic Fetal monitoringBaseline: 145 bpm, Variability: Good {> 6 bpm), Accelerations: Reactive, and Decelerations: Absent Uterine activityIntensity: mild, irregular Dilation: 5 Effacement (%): 60 Station: -1 Exam by:: Heywood Bene, RN   Prenatal labs: ABO, Rh: --/--/B POS (12/01 0208) Antibody: NEG (12/01  0208) Rubella: <0.90 (06/09 1057) RPR: Non Reactive (09/26 0812)  HBsAg: Negative (06/09 1057)  HIV: Non Reactive (09/26 0812)  GBS: NEGATIVE/-- (12/01 0129)  1 hr Glucola wnl Genetic screening  wnl Anatomy US wnl  Prenatal Transfer Tool  Maternal Diabetes: No Genetic Screening: Normal Maternal Ultrasounds/Referrals: Normal Fetal  Ultrasounds or other Referrals:  None Maternal Substance Abuse:  No Significant Maternal Medications:  None Significant Maternal Lab Results:  None and Other: rubella nonimmune Number of Prenatal Visits:greater than 3 verified prenatal visits Other Comments:  None  Results for orders placed or performed during the hospital encounter of 06/19/22 (from the past 24 hour(s))  Group B strep by PCR   Collection Time: 06/20/22  1:29 AM   Specimen: Vaginal/Rectal; Genital  Result Value Ref Range   Group B strep by PCR NEGATIVE NEGATIVE  Type and screen [place]   Collection Time: 06/20/22  2:08 AM  Result Value Ref Range   ABO/RH(D) B POS    Antibody Screen NEG    Sample Expiration      06/23/2022,2359 Performed at [facility], [place] [address], [place], [place] [address]   CBC   Collection Time: 06/20/22  2:11 AM  Result Value Ref Range   WBC 13.8 (H) 4.0 - 10.5 K/uL   RBC 3.49 (L) 3.87 - 5.11 MIL/uL   Hemoglobin 9.4 (L) 12.0 - 15.0 g/dL   HCT 28.2 (L) 36.0 - 46.0 %   MCV 80.8 80.0 - 100.0 fL   MCH 26.9 26.0 - 34.0 pg   MCHC 33.3 30.0 - 36.0 g/dL   RDW 13.7 11.5 - 15.5 %   Platelets 296 150 - 400 K/uL   nRBC 0.0 0.0 - 0.2 %    Patient Active Problem List   Diagnosis Date Noted   Anemia complicating pregnancy, third trimester 04/16/2022   Cervical dysplasia 02/27/2022   Low-lying placenta 02/13/2022   Rubella non-immune status, antepartum 12/28/2021   Supervision of low-risk pregnancy 12/26/2021    Assessment/Plan:  Samantha Peters is a 21 y.o. [id number] at 21w2dhere for expectant management of labor.  #Labor:  expectant management of labor #Pain: IV pain meds and epidural on request #FWB: Category 1 #ID:  GBS culture pending, rubella nonimmune - will need MMR PP #MOF: breast and bottle #MOC: depo #Circ:  N/A  Jiyun N. CRadene Knee MD  06/20/2022, 3:09 AM  I personally saw and evaluated the patient, performing the key elements of the service. I developed and verified the management plan that is described in the resident's/student's note, and I agree with the content with my edits above. VSS, HRR&R, Resp unlabored, Legs neg.  FNigel Berthold CNM 06/20/2022 8:37 AM

## 2022-06-20 NOTE — Progress Notes (Signed)
Samantha Peters is a 21 y.o. [id number] at [redacted]w[redacted]d   Subjective: Epidural in place. Patient did not experience any change with one hour nipple stim.  Objective: BP 95/62   Pulse 80   Temp 97.6 F (36.4 C) (Oral)   Resp 16   Ht 5\' 1"  (1.549 m)   Wt 73.8 kg   LMP 10/02/2021 (Within Days)   SpO2 95%   BMI 30.76 kg/m  No intake/output data recorded. No intake/output data recorded.  FHT:  FHR: 130 bpm, variability: moderate,  accelerations:  Present,  decelerations:  Absent UC:   uterine irritability, occasional ctx SVE:   Dilation: 5.5 Effacement (%): Thick Station: -1 Exam by:: sam Jaquarius Seder, cnm  Labs: Lab Results  Component Value Date   WBC 13.8 (H) 06/20/2022   HGB 9.4 (L) 06/20/2022   HCT 28.2 (L) 06/20/2022   MCV 80.8 06/20/2022   PLT 296 06/20/2022    Assessment / Plan: --Cervix remains 4-4.5 internal os, 5-5.5 external os, thick, -2 station --Cat I tracing --Apology offered to patient that she is not currently laboring, unable to induce at [redacted]w[redacted]d and no medical indication --Epidural off at 1035, access not discontinued in case of recurrence of ctx --Will plan to discharge home at noon pending improving sensation in lower extremities --Preemptively discussed possibility patient will return as labor check later today  -Plan for cervical exam by MAU Provider, day shift Providers update patient may return  [redacted]w[redacted]d, CNM 06/20/2022, 10:44 AM

## 2022-06-20 NOTE — Anesthesia Preprocedure Evaluation (Signed)
Anesthesia Evaluation  Patient identified by MRN, date of birth, ID band Patient awake    Reviewed: Allergy & Precautions, NPO status , Patient's Chart, lab work & pertinent test results  History of Anesthesia Complications Negative for: history of anesthetic complications  Airway Mallampati: III  TM Distance: >3 FB Neck ROM: Full    Dental no notable dental hx.    Pulmonary former smoker   Pulmonary exam normal breath sounds clear to auscultation       Cardiovascular negative cardio ROS  Rhythm:Regular Rate:Normal     Neuro/Psych    GI/Hepatic negative GI ROS, Neg liver ROS,,,  Endo/Other  negative endocrine ROS    Renal/GU negative Renal ROS     Musculoskeletal   Abdominal   Peds  Hematology  (+) Blood dyscrasia, anemia   Anesthesia Other Findings   Reproductive/Obstetrics (+) Pregnancy                              Anesthesia Physical Anesthesia Plan  ASA: 2  Anesthesia Plan: Epidural   Post-op Pain Management:    Induction:   PONV Risk Score and Plan:   Airway Management Planned:   Additional Equipment:   Intra-op Plan:   Post-operative Plan:   Informed Consent: I have reviewed the patients History and Physical, chart, labs and discussed the procedure including the risks, benefits and alternatives for the proposed anesthesia with the patient or authorized representative who has indicated his/her understanding and acceptance.       Plan Discussed with:   Anesthesia Plan Comments: (I have discussed risks of neuraxial anesthesia including but not limited to infection, bleeding, nerve injury, back pain, headache, seizures, and failure of block. Patient denies bleeding disorders and is not currently anticoagulated. Labs have been reviewed. Risks and benefits discussed. All patient's questions answered.  )         Anesthesia Quick Evaluation

## 2022-06-20 NOTE — Progress Notes (Signed)
Samantha Peters is a 21 y.o. [phone] at [redacted]w[redacted]d   Subjective: Endorses residual numbness on left side  Objective: BP 121/76   Pulse 93   Temp 98.4 F (36.9 C) (Oral)   Resp 16   Ht 5\' 1"  (1.549 m)   Wt 73.8 kg   LMP 10/02/2021 (Within Days)   SpO2 95%   BMI 30.76 kg/m  No intake/output data recorded. No intake/output data recorded.  FHT:  FHR: 140 bpm, variability: moderate,  accelerations:  Present,  decelerations:  Absent UC:   Rare SVE:   Dilation: 4.5 Effacement (%): Thick Station: -1 Exam by:: sam Cyla Haluska, cnm  Labs: Lab Results  Component Value Date   WBC 13.8 (H) 06/20/2022   HGB 9.4 (L) 06/20/2022   HCT 28.2 (L) 06/20/2022   MCV 80.8 06/20/2022   PLT 296 06/20/2022    Assessment / Plan: --Cervix rechecked s/p bedside assessment by Dr. 14/07/2021, OB Anethesia  Sampson Goon, CNM 06/20/2022, 4:37 PM

## 2022-06-20 NOTE — Progress Notes (Signed)
Pt seen in follow up. Report about previous events received from Dr Sampson Goon during hand off. Patient reports continued improvement in strength and sensation post discontinuation of epidural. Right motor exam unchanged. Left motor exam unchanged except 4/5 on left ankle flexion. However this is my 1st time examining the patient. The patient endorses some back pain since epidural placement which can be treated with over the counter Tylenol or NSAIDs along with a heating pad if desired. With patient endorsing continued improvement and denies radiculopathy, bowel/bladder dysfunction I'm alright with the patient being discharged when clinically appropriate per her OB team. If patient were to develop worsening back pain, radiculopathy, progressive numbness or weakness in her lower extremities, or bowel/bladder dysfunction the patient should return to MAU for further evaluation.   Val Eagle, MD  06/20/2022 10:18 PM

## 2022-06-20 NOTE — Anesthesia Procedure Notes (Signed)
Epidural Patient location during procedure: OB Start time: 06/20/2022 5:09 AM End time: 06/20/2022 5:13 AM  Staffing Anesthesiologist: Linton Rump, MD Performed: anesthesiologist   Preanesthetic Checklist Completed: patient identified, IV checked, site marked, risks and benefits discussed, surgical consent, monitors and equipment checked, pre-op evaluation and timeout performed  Epidural Patient position: sitting Prep: DuraPrep and site prepped and draped Patient monitoring: continuous pulse ox and blood pressure Approach: midline Location: L3-L4 Injection technique: LOR saline  Needle:  Needle type: Tuohy  Needle gauge: 17 G Needle length: 9 cm and 9 Needle insertion depth: 4 cm Catheter type: closed end flexible Catheter size: 19 Gauge Catheter at skin depth: 8 cm Test dose: negative  Assessment Events: blood not aspirated, injection not painful, no injection resistance, no paresthesia and negative IV test  Additional Notes The patient has requested an epidural for labor pain management. Risks and benefits including, but not limited to, infection, bleeding, local anesthetic toxicity, headache, hypotension, back pain, block failure, etc. were discussed with the patient. The patient expressed understanding and consented to the procedure. I confirmed that the patient has no bleeding disorders and is not taking blood thinners. I confirmed the patient's last platelet count with the nurse. A time-out was performed immediately prior to the procedure. Please see nursing documentation for vital signs. Sterile technique was used throughout the whole procedure. Once LOR achieved, the epidural catheter threaded easily without resistance. Aspiration of the catheter was negative for blood and CSF. The epidural was dosed slowly and an infusion was started.  1 attempt(s)Reason for block:procedure for pain

## 2022-06-20 NOTE — Progress Notes (Signed)
Samantha Peters is a 21 y.o. [id number] at [redacted]w[redacted]d  Subjective: Patient sleeping soundly  Objective: BP 110/61   Pulse 86   Temp 97.8 F (36.6 C) (Oral)   Resp 16   Ht 5\' 1"  (1.549 m)   Wt 73.8 kg   LMP 10/02/2021 (Within Days)   SpO2 95%   BMI 30.76 kg/m  No intake/output data recorded. No intake/output data recorded.  FHT:  FHR: 140 bpm, variability: moderate,  accelerations:  Present,  decelerations:  Absent UC:   rare SVE:   Dilation: 5.5 Effacement (%): Thick Station: -1 Exam by:: sam Sukhdeep Wieting, cnm  Labs: Lab Results  Component Value Date   WBC 13.8 (H) 06/20/2022   HGB 9.4 (L) 06/20/2022   HCT 28.2 (L) 06/20/2022   MCV 80.8 06/20/2022   PLT 296 06/20/2022    Assessment / Plan: --22 y.o. 22 at [redacted]w[redacted]d  --Cat I tracing --Normotensive --Not laboring, ctx now up to 13 min apart, patient sleeping soundly --Unable to electively induce prior to 39 weeks --IV iron complete --Epidural discontinued at 1035 --Status reviewed with [redacted]w[redacted]d, RN. Patient currently unable to independently lift either leg.  --Will continue to assess for return of sensation --Plan for discharge home when sensation improved  Selena Batten, CNM 06/20/2022, 2:35 PM

## 2022-06-20 NOTE — Discharge Summary (Signed)
Antenatal Physician Discharge Summary  Patient ID: Samantha Peters MRN: [id number] DOB/AGE: 11-May-2001 21 y.o.  Admit date: 06/19/2022 Discharge date: 06/20/2022  Admission Diagnoses: Labor  Discharge Diagnoses: False Labor   Prenatal Procedures: NST  Consults: Neonatology, Maternal Fetal Medicine  Hospital Course:  Samantha Peters is a 21 y.o. [id number] with IUP at 37w2dadmitted for labor.  She was admitted with contractions, noted to have a cervical exam of 6.5.  No leaking of fluid and no bleeding.  Due to painful contractions patient had epidural placed.  Patient was evaluated by provider and it was discovered that the initial cervical exam was not accurate and she was actually 4 to 4.5 cm dilated.  She was monitored throughout the day and no cervical change was appreciated.  Her epidural was removed.  She was complaining of back pain which was 8 out of 10.  She was evaluated by anesthesia on multiple occasions who reported that she was stable for discharge.  Her cervix was checked 1 last time prior to discharge and was unchanged from previous evaluations.  Strict return precautions were given.  She was deemed stable for discharge to home with outpatient follow up.  Discharge Exam: Temp:  [97.6 F (36.4 C)-98.5 F (36.9 C)] 98.5 F (36.9 C) (12/01 1904) Pulse Rate:  [65-123] 82 (12/01 2131) Resp:  [15-18] 16 (12/01 2131) BP: (83-133)/(47-92) 102/51 (12/01 2131) SpO2:  [95 %-100 %] 95 % (12/01 0700) Physical Examination: CONSTITUTIONAL: Well-developed, well-nourished female in no acute distress.  HENT:  Normocephalic, atraumatic EYES: Conjunctivae and EOM are normal. SKIN: Skin is warm and dry. No rash noted. Not diaphoretic. No erythema. No pallor. NEUROLOGIC: No cranial nerve deficit noted. PSYCHIATRIC: Normal mood and affect. Normal behavior. Normal judgment and thought content. CARDIOVASCULAR: Normal heart rate noted RESPIRATORY: Effort normal, no problems with respiration  noted MUSCULOSKELETAL: Normal range of motion.  ABDOMEN: Soft, nontender, nondistended, gravid. CERVIX: Dilation: 4.5 Effacement (%): 50 Cervical Position: Middle Station: -2 Presentation: Vertex Exam by:: Dr. CCaron Presume Fetal monitoring: FHR: 150 bpm, Variability: moderate, Accelerations: Present, Decelerations: Absent  Uterine activity: 5-7 contractions per hour  Significant Diagnostic Studies:  Results for orders placed or performed during the hospital encounter of 06/19/22 (from the past 168 hour(s))  Group B strep by PCR   Collection Time: 06/20/22  1:29 AM   Specimen: Vaginal/Rectal; Genital  Result Value Ref Range   Group B strep by PCR NEGATIVE NEGATIVE  Type and screen [place]  Collection Time: 06/20/22  2:08 AM  Result Value Ref Range   ABO/RH(D) B POS    Antibody Screen NEG    Sample Expiration      06/23/2022,2359 Performed at [facility] [address] [place] [address]  CBC   Collection Time: 06/20/22  2:11 AM  Result Value Ref Range   WBC 13.8 (H) 4.0 - 10.5 K/uL   RBC 3.49 (L) 3.87 - 5.11 MIL/uL   Hemoglobin 9.4 (L) 12.0 - 15.0 g/dL   HCT 28.2 (L) 36.0 - 46.0 %   MCV 80.8 80.0 - 100.0 fL   MCH 26.9 26.0 - 34.0 pg   MCHC 33.3 30.0 - 36.0 g/dL   RDW 13.7 11.5 - 15.5 %   Platelets 296 150 - 400 K/uL   nRBC 0.0 0.0 - 0.2 %  RPR   Collection Time: 06/20/22  2:11 AM  Result Value Ref Range   RPR Ser Ql NON REACTIVE NON REACTIVE  Results for  orders placed or performed in visit on 06/19/22 (from the past 168 hour(s))  GC/Chlamydia probe amp ([place])not at [facility]   Collection Time: 06/19/22 11:23 AM  Result Value Ref Range   Neisseria Gonorrhea Negative    Chlamydia Negative    Comment Normal Reference Ranger Chlamydia - Negative    Comment      Normal Reference Range Neisseria Gonorrhea - Negative  Results for orders placed or performed during the hospital encounter of 06/16/22 (from the past 168 hour(s))   Urinalysis, Routine w reflex microscopic Urine, Clean Catch   Collection Time: 06/16/22  7:17 PM  Result Value Ref Range   Color, Urine AMBER (A) YELLOW   APPearance CLEAR CLEAR   Specific Gravity, Urine >1.030 (H) 1.005 - 1.030   pH 6.0 5.0 - 8.0   Glucose, UA NEGATIVE NEGATIVE mg/dL   Hgb urine dipstick NEGATIVE NEGATIVE   Bilirubin Urine NEGATIVE NEGATIVE   Ketones, ur NEGATIVE NEGATIVE mg/dL   Protein, ur NEGATIVE NEGATIVE mg/dL   Nitrite NEGATIVE NEGATIVE   Leukocytes,Ua NEGATIVE NEGATIVE   [place] MFM OB FOLLOW UP  Result Date: 05/28/2022 ----------------------------------------------------------------------  OBSTETRICS REPORT                       (Signed Final 05/28/2022 12:20 pm) ---------------------------------------------------------------------- Patient Info  ID #:       [id number]                          D.O.B.:  12-22-2000 (21 yrs)  Name:       Samantha Peters                  Visit Date: 05/28/2022 11:34 am ---------------------------------------------------------------------- Performed By  Attending:        Tama High MD        Secondary Phy.:   [facility] MAU/Triage  Performed By:     Nathen May       [facility]                                                             [place] [facility]                                                             [facility]  Referred By:      Darlina Rumpf ---------------------------------------------------------------------- Orders  #  Description                           Code  Ordered By  1  [place] MFM OB FOLLOW UP                   40814.48    Valeda Malm ----------------------------------------------------------------------  #  Order #                     Accession #                Episode #  1  [id number]                   [id number]                 [id number] ----------------------------------------------------------------------  Indications  Obesity complicating pregnancy, third          O99.213  trimester (pre-G BMI 32)  Low lying placenta, antepartum (resolved)      O44.40  Encounter for other antenatal screening        Z36.2  follow-up  [redacted] weeks gestation of pregnancy                Z3A.34  Low risk NIPS ---------------------------------------------------------------------- Vital Signs  BP:          131/73 ---------------------------------------------------------------------- Fetal Evaluation  Num Of Fetuses:         1  Fetal Heart Rate(bpm):  165  Cardiac Activity:       Observed  Presentation:           Cephalic  Placenta:               Anterior  P. Cord Insertion:      Previously Visualized  Amniotic Fluid  AFI FV:      Within normal limits  AFI Sum(cm)     %Tile       Largest Pocket(cm)  21.3            80          8.3  RUQ(cm)       RLQ(cm)       LUQ(cm)        LLQ(cm)  8.3           5.1           4.6            3.3 ---------------------------------------------------------------------- Biometry  BPD:      81.4  mm     G. Age:  32w 5d         14  %    CI:        72.82   %    70 - 86                                                          FL/HC:      20.3   %    19.4 - 21.8  HC:      303.3  mm     G. Age:  33w 5d         11  %    HC/AC:      1.04        0.96 - 1.11  AC:      290.6  mm     G. Age:  33w 0d  26  %    FL/BPD:     75.6   %    71 - 87  FL:       61.5  mm     G. Age:  31w 6d        3.9  %    FL/AC:      21.2   %    20 - 24  CER:      44.2  mm     G. Age:  34w 3d         52  %  LV:        3.9  mm  CM:        2.1  mm  Est. FW:    2044  gm      4 lb 8 oz     13  % ---------------------------------------------------------------------- OB History  Gravidity:    5         Term:   2        Prem:   0        SAB:   1  TOP:          1       Ectopic:  0        Living: 2 ---------------------------------------------------------------------- Gestational Age  LMP:           34w 0d        Date:  10/02/21                  EDD:    07/09/22  U/S Today:     32w 6d                                        EDD:   07/17/22  Best:          34w 0d     Det. By:  LMP  (10/02/21)          EDD:   07/09/22 ---------------------------------------------------------------------- Anatomy  Cranium:               Appears normal         LVOT:                   Previously seen  Cavum:                 Appears normal         Aortic Arch:            Previously seen  Ventricles:            Appears normal         Ductal Arch:            Previously seen  Choroid Plexus:        Previously seen        Diaphragm:              Appears normal  Cerebellum:            Previously seen        Stomach:                Appears normal, left  sided  Posterior Fossa:       Previously seen        Abdomen:                Previously seen  Nuchal Fold:           Not applicable (>03    Abdominal Wall:         Previously seen                         wks GA)  Face:                  Orbits and profile     Cord Vessels:           Previously seen                         previously seen  Lips:                  Previously seen        Kidneys:                Appear normal  Palate:                Not well visualized    Bladder:                Appears normal  Thoracic:              Previously seen        Spine:                  Previously seen  Heart:                 Previously seen        Upper Extremities:      Previously seen  RVOT:                  Previously seen        Lower Extremities:      Previously seen  Other:  VC, 3VV and 3VTV, Heels/feet and open hands/5th digits, Nasal          bone, lenses and falx previously visualized. Female gender previously          seen. ---------------------------------------------------------------------- Impression  Patient returned for fetal growth assessment.  On previous  ultrasound, low-lying placenta had resolved.  She does not have gestational diabetes.  Fetal growth is appropriate  for gestational age.  Amniotic fluid  is normal and good fetal activity seen.  Cephalic presentation.  There is no evidence of low-lying placenta.  I reassured the patient of the findings. ---------------------------------------------------------------------- Recommendations  Follow-up scans as clinically indicated. ----------------------------------------------------------------------                 Tama High, MD Electronically Signed Final Report   05/28/2022 12:20 pm ----------------------------------------------------------------------   Future Appointments  Date Time Provider [place]  06/25/2022  9:00 AM CENTERING PROVIDER [facility]  07/02/2022  3:35 PM Griffin Basil, MD [facility]  07/09/2022  9:00 AM CENTERING PROVIDER [facility]    Discharge Condition: Stable  Discharge disposition: 01-Home or Self Care        Allergies as of 06/20/2022   No Known Allergies      Medication List     TAKE these medications    Blood Pressure Kit Devi 1 Device by Does  not apply route as needed.   cetirizine 10 MG tablet Commonly known as: ZYRTEC Take 1 tablet (10 mg total) by mouth daily.   cyclobenzaprine 10 MG tablet Commonly known as: FLEXERIL Take 1 tablet (10 mg total) by mouth 2 (two) times daily as needed for muscle spasms.   ferrous sulfate 325 (65 FE) MG tablet Commonly known as: FerrouSul Take 1 tablet (325 mg total) by mouth every other day.   Gojji Weight Scale Misc 1 Device by Does not apply route as needed.   PRENATAL GUMMIES PO Take 1 tablet by mouth daily.         Signed: Concepcion Living M.D. 06/20/2022, 10:40 PM

## 2022-06-20 NOTE — Progress Notes (Signed)
Patient Vitals for the past 4 hrs:  BP Temp Temp src Pulse Resp SpO2  06/20/22 0701 111/63 -- -- 74 -- --  06/20/22 0700 -- -- -- -- -- 95 %  06/20/22 0655 -- -- -- -- -- 95 %  06/20/22 0650 -- -- -- -- -- 96 %  06/20/22 0646 116/79 -- -- 73 -- --  06/20/22 0645 -- -- -- -- -- 96 %  06/20/22 0640 -- -- -- -- -- 96 %  06/20/22 0635 -- -- -- -- -- 96 %  06/20/22 0631 107/68 -- -- 81 -- --  06/20/22 0630 -- -- -- -- -- 98 %  06/20/22 0625 -- -- -- -- -- 98 %  06/20/22 0620 -- -- -- -- -- 99 %  06/20/22 0616 106/71 -- -- 68 -- --  06/20/22 0615 -- -- -- -- -- 99 %  06/20/22 0610 -- -- -- -- -- 98 %  06/20/22 0605 -- -- -- -- -- 98 %  06/20/22 0601 108/62 -- -- 73 18 --  06/20/22 0600 -- -- -- -- -- 99 %  06/20/22 0555 -- -- -- -- -- 98 %  06/20/22 0551 111/70 -- -- 74 18 --  06/20/22 0550 -- -- -- -- -- 100 %  06/20/22 0546 112/69 -- -- 84 -- --  06/20/22 0545 -- -- -- -- -- 100 %  06/20/22 0541 112/73 -- -- 76 18 --  06/20/22 0540 -- -- -- -- -- 100 %  06/20/22 0536 115/75 -- -- 87 18 --  06/20/22 0535 -- -- -- -- -- 98 %  06/20/22 0531 125/69 -- -- 93 16 --  06/20/22 0530 -- -- -- -- -- 99 %  06/20/22 0526 109/68 -- -- 95 18 --  06/20/22 0525 -- -- -- -- -- 99 %  06/20/22 0521 113/76 97.9 F (36.6 C) Oral 91 18 --  06/20/22 0520 -- -- -- -- -- 99 %  06/20/22 0516 (!) 111/92 -- -- (!) 101 -- --  06/20/22 0515 -- -- -- -- -- 100 %  06/20/22 0513 121/69 -- -- 79 -- --  06/20/22 0512 119/77 -- -- 79 -- --  06/20/22 0510 -- -- -- -- -- 100 %  06/20/22 0505 -- -- -- -- -- 98 %  06/20/22 0500 -- -- -- -- -- 96 %  06/20/22 0455 -- -- -- -- -- 96 %  06/20/22 0450 -- -- -- -- -- 97 %  06/20/22 0445 -- -- -- -- -- 97 %  06/20/22 0443 109/72 -- -- 74 -- --  06/20/22 0420 -- -- -- -- -- 96 %  06/20/22 0415 -- -- -- -- -- 97 %  06/20/22 0410 -- -- -- -- -- 100 %  06/20/22 0405 -- -- -- -- -- 100 %  06/20/22 0402 (!) 112/53 98.1 F (36.7 C) Oral 79 16 99 %  06/20/22 0355 -- -- --  -- -- 99 %  06/20/22 0350 -- -- -- -- -- 100 %  06/20/22 0345 -- -- -- -- -- 97 %  06/20/22 0340 -- -- -- -- -- 98 %  06/20/22 0335 -- -- -- -- -- 98 %  06/20/22 0330 -- -- -- -- -- 99 %  06/20/22 0325 -- -- -- -- -- 99 %   Pt got an epidural a few hours ago, sleeping.  FHR Cat 1.  Ctx mild and irregular.  Cx exam is more 4-5, definitely not 6-7.  Discussed  that if her labor does not pick back up, we cannot induce electively at <39 weeks.  Will continue to observe for labor.

## 2022-06-20 NOTE — Telephone Encounter (Signed)
Samantha Peters called and left a voicemail, which I had difficulty hearing but sounds like she stated she was having concerns about her labor. Patient is currently admitted in labor and delivery as of 06/20/2022.  Called patient and she informed me that she is being sent home. She is concerned and uncomfortable about being sent home because she had already received her epidural and she was 5 and a half cm. I asked patient to hold and I spoke with and redirected patient to midwife.

## 2022-06-20 NOTE — Progress Notes (Signed)
Samantha Peters is a 21 y.o. [id number] at [redacted]w[redacted]d   Subjective: Sleepy with epidural in place, support person at bedside and participating in care.  Objective: BP 114/74   Pulse 89   Temp 97.6 F (36.4 C) (Oral)   Resp 16   Ht 5\' 1"  (1.549 m)   Wt 73.8 kg   LMP 10/02/2021 (Within Days)   SpO2 95%   BMI 30.76 kg/m  No intake/output data recorded. No intake/output data recorded.  FHT:  FHR: 125 bpm, variability: moderate,  accelerations:  Present,  decelerations:  Absent UC:   irregular, every 1-3 minutes SVE:   Dilation: 5.5 Effacement (%): Thick Station: -1 Exam by:: sam Beulah Matusek, cnm  Labs: Lab Results  Component Value Date   WBC 13.8 (H) 06/20/2022   HGB 9.4 (L) 06/20/2022   HCT 28.2 (L) 06/20/2022   MCV 80.8 06/20/2022   PLT 296 06/20/2022    Assessment / Plan: --21 y.o. 21 at [redacted]w[redacted]d  --Admitted for SOL with documented cervical exam of 6.5 prior to admission --Cervix per my exam is 4-4.5, stretchy to 5/5.5 --Will recheck in one hour for consistent exam with same provider --Patient may elect to perform nipple stim on herself during that interval --CNM will return to bedside at 1030 to recheck cervix --Will discharge home if no cervical change at next check  [redacted]w[redacted]d, CNM 06/20/2022, 9:25 AM

## 2022-06-21 NOTE — Anesthesia Postprocedure Evaluation (Signed)
Anesthesia Post Note  Patient: Samantha Peters  Procedure(s) Performed: AN AD HOC LABOR EPIDURAL     Patient location during evaluation: Mother Baby Anesthesia Type: Epidural Level of consciousness: awake and alert Pain management: pain level controlled Vital Signs Assessment: post-procedure vital signs reviewed and stable Respiratory status: spontaneous breathing, nonlabored ventilation and respiratory function stable Cardiovascular status: stable Postop Assessment: no headache, no backache and epidural receding Anesthetic complications: no   No notable events documented.  Last Vitals:  Vitals:   06/20/22 2131 06/20/22 2258  BP: (!) 102/51 118/72  Pulse: 82 94  Resp: 16 15  Temp:  36.7 C  SpO2:      Last Pain:  Vitals:   06/20/22 2258  TempSrc: Oral  PainSc: 8    Pain Goal:                   Kennieth Rad

## 2022-06-22 ENCOUNTER — Other Ambulatory Visit: Payer: Self-pay

## 2022-06-22 ENCOUNTER — Encounter (HOSPITAL_COMMUNITY): Payer: Self-pay | Admitting: Obstetrics and Gynecology

## 2022-06-22 ENCOUNTER — Inpatient Hospital Stay (HOSPITAL_COMMUNITY)
Admission: AD | Admit: 2022-06-22 | Discharge: 2022-06-22 | Disposition: A | Discharge status: Home / Self Care | Payer: Medicaid Other | Attending: Obstetrics and Gynecology | Admitting: Obstetrics and Gynecology

## 2022-06-22 DIAGNOSIS — O471 False labor at or after 37 completed weeks of gestation: Secondary | ICD-10-CM | POA: Insufficient documentation

## 2022-06-22 DIAGNOSIS — M549 Dorsalgia, unspecified: Secondary | ICD-10-CM

## 2022-06-22 DIAGNOSIS — O479 False labor, unspecified: Secondary | ICD-10-CM

## 2022-06-22 DIAGNOSIS — O26893 Other specified pregnancy related conditions, third trimester: Secondary | ICD-10-CM | POA: Diagnosis not present

## 2022-06-22 DIAGNOSIS — Z3689 Encounter for other specified antenatal screening: Secondary | ICD-10-CM

## 2022-06-22 DIAGNOSIS — B9689 Other specified bacterial agents as the cause of diseases classified elsewhere: Secondary | ICD-10-CM | POA: Diagnosis not present

## 2022-06-22 DIAGNOSIS — Z3A37 37 weeks gestation of pregnancy: Secondary | ICD-10-CM | POA: Diagnosis not present

## 2022-06-22 DIAGNOSIS — O23593 Infection of other part of genital tract in pregnancy, third trimester: Secondary | ICD-10-CM | POA: Insufficient documentation

## 2022-06-22 DIAGNOSIS — Z3493 Encounter for supervision of normal pregnancy, unspecified, third trimester: Secondary | ICD-10-CM

## 2022-06-22 LAB — CBC WITH DIFFERENTIAL/PLATELET
Abs Immature Granulocytes: 0.06 10*3/uL (ref 0.00–0.07)
Basophils Absolute: 0 10*3/uL (ref 0.0–0.1)
Basophils Relative: 0 %
Eosinophils Absolute: 0.3 10*3/uL (ref 0.0–0.5)
Eosinophils Relative: 2 %
HCT: 27.6 % — ABNORMAL LOW (ref 36.0–46.0)
Hemoglobin: 9.1 g/dL — ABNORMAL LOW (ref 12.0–15.0)
Immature Granulocytes: 1 %
Lymphocytes Relative: 13 %
Lymphs Abs: 1.6 10*3/uL (ref 0.7–4.0)
MCH: 27.1 pg (ref 26.0–34.0)
MCHC: 33 g/dL (ref 30.0–36.0)
MCV: 82.1 fL (ref 80.0–100.0)
Monocytes Absolute: 0.7 10*3/uL (ref 0.1–1.0)
Monocytes Relative: 6 %
Neutro Abs: 10.1 10*3/uL — ABNORMAL HIGH (ref 1.7–7.7)
Neutrophils Relative %: 78 %
Platelets: 272 10*3/uL (ref 150–400)
RBC: 3.36 MIL/uL — ABNORMAL LOW (ref 3.87–5.11)
RDW: 13.7 % (ref 11.5–15.5)
WBC: 12.8 10*3/uL — ABNORMAL HIGH (ref 4.0–10.5)
nRBC: 0 % (ref 0.0–0.2)

## 2022-06-22 LAB — WET PREP, GENITAL
Sperm: NONE SEEN
Trich, Wet Prep: NONE SEEN
WBC, Wet Prep HPF POC: >=10 — AB (ref <10)
Yeast Wet Prep HPF POC: NONE SEEN

## 2022-06-22 MED ORDER — LIDOCAINE 5 % EX PTCH
2.0000 | MEDICATED_PATCH | CUTANEOUS | Status: DC
  Administered 2022-06-22: 2 via TRANSDERMAL
  Filled 2022-06-22: qty 2

## 2022-06-22 MED ORDER — FAMOTIDINE IN NACL 20-0.9 MG/50ML-% IV SOLN
20.0000 mg | Freq: Once | INTRAVENOUS | Status: AC
  Administered 2022-06-22: 20 mg via INTRAVENOUS
  Filled 2022-06-22: qty 50

## 2022-06-22 MED ORDER — LIDOCAINE 5 % EX PTCH
2.0000 | MEDICATED_PATCH | CUTANEOUS | 0 refills | Status: AC
  Filled 2022-06-22: qty 6, 3d supply, fill #0

## 2022-06-22 MED ORDER — METRONIDAZOLE 500 MG PO TABS
500.0000 mg | ORAL_TABLET | Freq: Two times a day (BID) | ORAL | 0 refills | Status: DC
  Filled 2022-06-22: qty 14, 7d supply, fill #0

## 2022-06-22 MED ORDER — LACTATED RINGERS IV SOLN: INTRAVENOUS | Status: DC

## 2022-06-22 MED ORDER — FENTANYL CITRATE (PF) 100 MCG/2ML IJ SOLN
100.0000 ug | Freq: Once | INTRAMUSCULAR | Status: AC
  Administered 2022-06-22: 100 ug via INTRAVENOUS
  Filled 2022-06-22: qty 2

## 2022-06-22 MED ORDER — ONDANSETRON HCL 4 MG/2ML IJ SOLN
4.0000 mg | Freq: Once | INTRAMUSCULAR | Status: AC
  Administered 2022-06-22: 4 mg via INTRAVENOUS
  Filled 2022-06-22: qty 2

## 2022-06-22 MED ORDER — LACTATED RINGERS IV BOLUS
1000.0000 mL | Freq: Once | INTRAVENOUS | Status: AC
  Administered 2022-06-22: 1000 mL via INTRAVENOUS

## 2022-06-22 MED ORDER — CYCLOBENZAPRINE HCL 5 MG PO TABS
10.0000 mg | ORAL_TABLET | Freq: Once | ORAL | Status: AC
  Administered 2022-06-22: 10 mg via ORAL
  Filled 2022-06-22: qty 2

## 2022-06-22 MED ORDER — MAG-OXIDE 200 MG PO TABS
400.0000 mg | ORAL_TABLET | Freq: Every day | ORAL | 3 refills | Status: AC
  Filled 2022-06-22: qty 60, 30d supply, fill #0

## 2022-06-22 NOTE — Telephone Encounter (Signed)
Spoke to patient when B'Aisha called her back on 06/20/22 - reiterated what the L&D team has been telling her. Validated her fear and frustration but explained again why we do not augment prior to 37wks and reviewed comfort measures when she gets home. Pt calm when we hung up, verbalized understanding of the plan.  Edd Arbour, CNM, MSN, IBCLC Certified Nurse Midwife, [facility]

## 2022-06-22 NOTE — MAU Provider Note (Incomplete Revision)
Chief Complaint:  Contractions   Event Date/Time   First Provider Initiated Contact with Patient 06/22/22 2102     HPI: Samantha Peters is a 21 y.o. [id number] at 80w4dwho presents to maternity admissions reporting contractions, spotting and uterine tenderness. Was admitted 06/20/22 for labor at 37.2 weeks, but after receiving epidural, pt was found to not be as dilated as previously reported and contractions spaced out. Since she was less that 39 weeks there was no indication for augmentation so epidural was discontinued and she discharged home.   Associated signs and symptoms: Neg for  fever, chill, LOF. Good fetal movement.   Pregnancy Course:   Past Medical History:  Diagnosis Date   Bronchitis    Elective abortion    History of umbilical hernia [id number]  OB History  Gravida Para Term Preterm AB Living  _0 0 1 2  SAB IAB Ectopic Multiple Live Births  0 1     2    # Outcome Date GA Lbr Len/2nd Weight Sex Delivery Anes PTL Lv  4 Current           3 IAB 04/2021          2 Term 07/09/16 [id number] g F Vag-Spont EPI Y LIV     Birth Comments: was told was preterm  1 Term 12/28/14 [id number] g M Vag-Spont EPI Y LIV     Birth Comments: wnl   Past Surgical History:  Procedure Laterality Date   HERNIA REPAIR     Family History  Problem Relation Age of Onset   Diabetes Mother    Hypertension Mother    Heart failure Mother    Kidney disease Mother    Hernia Father    Social History   Tobacco Use   Smoking status: Former    Types: Cigarettes, Cigars    Quit date: 11/06/2021    Years since quitting: 0.6   Smokeless tobacco: Never  Vaping Use   Vaping Use: Former   Quit date: 08/27/2021   Substances: Flavoring  Substance Use Topics   Alcohol use: No   Drug use: No   No Known Allergies Medications Prior to Admission  Medication Sig Dispense Refill Last Dose   Prenatal MV & Min w/FA-DHA (PRENATAL GUMMIES PO) Take 1 tablet by mouth daily.   06/22/2022   Blood Pressure  Monitoring (BLOOD PRESSURE KIT) DEVI 1 Device by Does not apply route as needed. 1 each 0    cetirizine (ZYRTEC) 10 MG tablet Take 1 tablet (10 mg total) by mouth daily. (Patient not taking: Reported on 05/14/2022) 30 tablet 1    cyclobenzaprine (FLEXERIL) 10 MG tablet Take 1 tablet (10 mg total) by mouth 2 (two) times daily as needed for muscle spasms. 60 tablet 0    ferrous sulfate (FERROUSUL) 325 (65 FE) MG tablet Take 1 tablet (325 mg total) by mouth every other day. (Patient not taking: Reported on 05/28/2022) 60 tablet 1    Misc. Devices (GOJJI WEIGHT SCALE) MISC 1 Device by Does not apply route as needed. 1 each 0     I have reviewed patient's Past Medical Hx, Surgical Hx, Family Hx, Social Hx, medications and allergies.   ROS:  Review of Systems  Constitutional:  Negative for chills and fever.  Gastrointestinal:  Positive for abdominal pain (Low abd cramping and tenderness). Negative for nausea and vomiting.  Genitourinary:  Positive for vaginal bleeding (spotting). Negative for dysuria.  Musculoskeletal:  Positive for  back pain (Contraction pain in low back).    Physical Exam  Patient Vitals for the past 24 hrs:  BP Temp Temp src Pulse Resp SpO2 Height Weight  06/22/22 1550 -- -- -- -- -- 99 % -- --  06/22/22 1549 127/75 -- -- 92 -- -- -- --  06/22/22 1535 119/69 98.1 F (36.7 C) Oral 94 18 100 % -- --  06/22/22 1530 -- -- -- -- -- -- _0  (1.549 m) 74.5 kg   Constitutional: Well-developed, well-nourished female in mild-mod distress, sitting upright hunched over a pillow. .  Cardiovascular: normal rate Respiratory: normal effort GI: Abd soft, mild-mod lower uterine tenderness., gravid appropriate for gestational age.  MS: Extremities nontender, no edema, normal ROM Neurologic: Alert and oriented x 4.  GU:  Pelvic: NEFG, physiologic discharge, no blood, cervix clean. No CMT  3.5/50/-3 per Wendall Stade, RN  Dilation: 4 Effacement (%): 50 Cervical Position:  Posterior Station: -2 Presentation: Vertex Exam by:: weston,rn  FHT:  Baseline 140 , moderate variability, accelerations present, no decelerations Contractions: q 2-7 mins, mild-mod   Labs:  Results for orders placed or performed during the hospital encounter of 06/22/22 (from the past 24 hour(s))  CBC with Differential/Platelet     Status: Abnormal   Collection Time: 06/22/22  8:33 PM  Result Value Ref Range   WBC 12.8 (H) 4.0 - 10.5 K/uL   RBC 3.36 (L) 3.87 - 5.11 MIL/uL   Hemoglobin 9.1 (L) 12.0 - 15.0 g/dL   HCT 27.6 (L) 36.0 - 46.0 %   MCV 82.1 80.0 - 100.0 fL   MCH 27.1 26.0 - 34.0 pg   MCHC 33.0 30.0 - 36.0 g/dL   RDW 13.7 11.5 - 15.5 %   Platelets 272 150 - 400 K/uL   nRBC 0.0 0.0 - 0.2 %   Neutrophils Relative % 78 %   Neutro Abs 10.1 (H) 1.7 - 7.7 K/uL   Lymphocytes Relative 13 %   Lymphs Abs 1.6 0.7 - 4.0 K/uL   Monocytes Relative 6 %   Monocytes Absolute 0.7 0.1 - 1.0 K/uL   Eosinophils Relative 2 %   Eosinophils Absolute 0.3 0.0 - 0.5 K/uL   Basophils Relative 0 %   Basophils Absolute 0.0 0.0 - 0.1 K/uL   Immature Granulocytes 1 %   Abs Immature Granulocytes 0.06 0.00 - 0.07 K/uL   Imaging:  NA  MAU Course: Orders Placed This Encounter  Procedures   CBC with Differential/Platelet   Meds ordered this encounter  Medications   lactated ringers bolus 1,000 mL   fentaNYL (SUBLIMAZE) injection 100 mcg   fentaNYL (SUBLIMAZE) injection 100 mcg   lactated ringers infusion   Mild improvement with Fentanyl and LR bolus.   Requesting more pain meds.  Will discuss pt complaint of significant uterine tenderness with attending. CBC w/ dif drawn.  Fentanyl ordered.  Report given to Roman Forest, [place], [place] 06/22/2022 9:02 PM  Reassessment: - Fentanyl did not help. Pt sitting up eating, not contracting much. Has waves of hot flashes with nausea. WBC unchanged from prior. Discussed continued inability to admit for labor when she is  technically in prodromal. Gave apologies and offered ways to help her be more comfortable while she waits for active labor to set in. Also offered eIOL at 39wks if she has not delivered before then.  - Ordered wet prep to check for BV considering she is c/o uterine tenderness - Will order lidocaine patches for her  back, give zofran/pepcid (she tried to eat and got very nauseated with belching but did keep her food down) as well as a dose of flexeril. Pt amenable to going home after this. Encouraged her to focus on eating, hydration and rest but can also do the [employer].

## 2022-06-22 NOTE — MAU Provider Note (Cosign Needed Addendum)
Chief Complaint:  Contractions   Event Date/Time   First Provider Initiated Contact with Patient 06/22/22 2102     HPI: Samantha Peters is a 21 y.o. [id number] at 80w4dwho presents to maternity admissions reporting contractions, spotting and uterine tenderness. Was admitted 06/20/22 for labor at 37.2 weeks, but after receiving epidural, pt was found to not be as dilated as previously reported and contractions spaced out. Since she was less that 39 weeks there was no indication for augmentation so epidural was discontinued and she discharged home.   Associated signs and symptoms: Neg for  fever, chill, LOF. Good fetal movement.   Pregnancy Course:   Past Medical History:  Diagnosis Date   Bronchitis    Elective abortion    History of umbilical hernia 25498  OB History  Gravida Para Term Preterm AB Living  _0 0 1 2  SAB IAB Ectopic Multiple Live Births  0 1     2    # Outcome Date GA Lbr Len/2nd Weight Sex Delivery Anes PTL Lv  4 Current           3 IAB 04/2021          2 Term 07/09/16 [id number] g F Vag-Spont EPI Y LIV     Birth Comments: was told was preterm  1 Term 12/28/14 [id number] g M Vag-Spont EPI Y LIV     Birth Comments: wnl   Past Surgical History:  Procedure Laterality Date   HERNIA REPAIR     Family History  Problem Relation Age of Onset   Diabetes Mother    Hypertension Mother    Heart failure Mother    Kidney disease Mother    Hernia Father    Social History   Tobacco Use   Smoking status: Former    Types: Cigarettes, Cigars    Quit date: 11/06/2021    Years since quitting: 0.6   Smokeless tobacco: Never  Vaping Use   Vaping Use: Former   Quit date: 08/27/2021   Substances: Flavoring  Substance Use Topics   Alcohol use: No   Drug use: No   No Known Allergies Medications Prior to Admission  Medication Sig Dispense Refill Last Dose   Prenatal MV & Min w/FA-DHA (PRENATAL GUMMIES PO) Take 1 tablet by mouth daily.   06/22/2022   Blood Pressure  Monitoring (BLOOD PRESSURE KIT) DEVI 1 Device by Does not apply route as needed. 1 each 0    cetirizine (ZYRTEC) 10 MG tablet Take 1 tablet (10 mg total) by mouth daily. (Patient not taking: Reported on 05/14/2022) 30 tablet 1    cyclobenzaprine (FLEXERIL) 10 MG tablet Take 1 tablet (10 mg total) by mouth 2 (two) times daily as needed for muscle spasms. 60 tablet 0    ferrous sulfate (FERROUSUL) 325 (65 FE) MG tablet Take 1 tablet (325 mg total) by mouth every other day. (Patient not taking: Reported on 05/28/2022) 60 tablet 1    Misc. Devices (GOJJI WEIGHT SCALE) MISC 1 Device by Does not apply route as needed. 1 each 0     I have reviewed patient's Past Medical Hx, Surgical Hx, Family Hx, Social Hx, medications and allergies.   ROS:  Review of Systems  Constitutional:  Negative for chills and fever.  Gastrointestinal:  Positive for abdominal pain (Low abd cramping and tenderness). Negative for nausea and vomiting.  Genitourinary:  Positive for vaginal bleeding (spotting). Negative for dysuria.  Musculoskeletal:  Positive for  back pain (Contraction pain in low back).    Physical Exam  Patient Vitals for the past 24 hrs:  BP Temp Temp src Pulse Resp SpO2 Height Weight  06/22/22 1550 -- -- -- -- -- 99 % -- --  06/22/22 1549 127/75 -- -- 92 -- -- -- --  06/22/22 1535 119/69 98.1 F (36.7 C) Oral 94 18 100 % -- --  06/22/22 1530 -- -- -- -- -- -- _0  (1.549 m) 74.5 kg   Constitutional: Well-developed, well-nourished female in mild-mod distress, sitting upright hunched over a pillow. .  Cardiovascular: normal rate Respiratory: normal effort GI: Abd soft, mild-mod lower uterine tenderness., gravid appropriate for gestational age.  MS: Extremities nontender, no edema, normal ROM Neurologic: Alert and oriented x 4.  GU:  Pelvic: NEFG, physiologic discharge, no blood, cervix clean. No CMT  3.5/50/-3 per Wendall Stade, RN  Dilation: 4 Effacement (%): 50 Cervical Position:  Posterior Station: -2 Presentation: Vertex Exam by:: weston,rn  FHT:  Baseline 140 , moderate variability, accelerations present, no decelerations Contractions: q 2-7 mins, mild-mod   Labs:   Imaging:  NA  MAU Course: Orders Placed This Encounter  Procedures   CBC with Differential/Platelet   Meds ordered this encounter  Medications   lactated ringers bolus 1,000 mL   fentaNYL (SUBLIMAZE) injection 100 mcg   fentaNYL (SUBLIMAZE) injection 100 mcg   lactated ringers infusion   Mild improvement with Fentanyl and LR bolus.   Requesting more pain meds.  Will discuss pt complaint of significant uterine tenderness with attending. CBC w/ dif drawn.  Fentanyl ordered.  Report given to [place],   [place], [place], [place] 06/22/2022 9:02 PM

## 2022-06-22 NOTE — MAU Note (Signed)
Samantha Peters is a 20 y.o. at [redacted]w[redacted]d here in MAU reporting: she's having regular ctxs all day, hasn't been timing them.  Denies LOF, reports intermittent spotting with wiping.  Endorses +FM LMP: NA Onset of complaint: today  Pain score: 8 Vitals:   06/22/22 1535  BP: 119/69  Pulse: 94  Resp: 18  Temp: 98.1 F (36.7 C)  SpO2: 100%     FHT:141 bpm Lab orders placed from triage:   None

## 2022-06-22 NOTE — Discharge Instructions (Signed)
The [employer]uit  This circuit takes at least 90 minutes to complete so clear your schedule and make mental preparations so you can relax in your environment. The second step requires a lot of pillows so gather them up before beginning Before starting, you should empty your bladder! Have a nice drink nearby, and make sure it has a straw! If you are having contractions, this circuit should be done through contractions, try not to change positions between steps Before you begin...  "I named this 'circuit' after my friend Samantha Peters, who shared and discussed it with me when I was working with a client whose labor seemed to be stalled out and no longer progressing... This circuit is useful to help get the baby lined up, ideally, in the "Left Occiput Anterior" (LOA) Position, both before labor begins and when some corrections need to be done during labor. Prenatally, this position set can help to rotate a baby. As a natural method of induction, this can help get things going if baby just needed a gentle nudge of position to set things off. To the best of my knowledge, this group of positions will not "hurt" a baby that is already lined up correctly." - Samantha Peters   Step One: Open-knee Chest Stay in this position for 30 minutes, start in cat/cow, then drop your chest as low as you can to the bed or the floor and your bottom as high as you can. Knees should be fairly wide apart, and the angle between the torso/thighs should be wider than 90 degrees. Wiggle around, prop with lots of pillows and use this time to get totally relaxed. This position allows the baby to scoot out of the pelvis a bit and gives them room to rotate, shift their head position, etc. If the pregnant person finds it helpful, careful positioning with a rebozo under the belly, with gentle tension from a support person behind can help maintain this position for the full 30 minutes.  Step Two:Exaggerated Left Side  Lying Roll to your left side, bringing your top leg as high as possible and keeping your bottom leg straight. Roll forward as much as possible, again using a lot of pillows. Sink into the bed and relax some more. If you fall asleep, that's totally okay and you can stay there! If not, stay here for at least another half an hour. Try and get your top right leg up towards your head and get as rolled over onto your belly as much as possible. If you repeat the circuit during labor, try alternating left and right sides. We know the photo the left is actually right side... just flip the image in your head.  Step Three: Moving and Lunges Lunge, walk stairs facing sideways, 2 at a time, (have a spotter downstairs of you!), take a walk outside with one foot on the curb and the other on the street, sit on a birth ball and hula- anything that's upright and putting your pelvis in open, asymmetrical positions. Spend at least 30 minutes doing this one as well to give your baby a chance to move down. If you are lunging or stair or curb walking, you should lunge/walk/go up stairs in the direction that feels better to you. The key with the lunge is that the toes of the higher leg and mom's belly button should be at right angles. Do not lunge over your knee, that closes the pelvis.     Samantha Hamilton Peters: Circuit Creator - [email or web address] Samantha   Peters, CD, BDT (DONA), LCCE, FACCE: Supporting Content - [email or web address] Samantha Peters: Photography - [email or web address] Kate Dewey CD/CDT (BAI): Print and Webmaster - [email or web address] [employer] [email or web address]  

## 2022-06-23 ENCOUNTER — Other Ambulatory Visit (HOSPITAL_COMMUNITY): Payer: Self-pay

## 2022-06-23 LAB — CULTURE, BETA STREP (GROUP B ONLY): Strep Gp B Culture: NEGATIVE

## 2022-06-25 ENCOUNTER — Ambulatory Visit (INDEPENDENT_AMBULATORY_CARE_PROVIDER_SITE_OTHER): Payer: Medicaid Other | Admitting: Family Medicine

## 2022-06-25 ENCOUNTER — Other Ambulatory Visit: Payer: Self-pay | Admitting: Advanced Practice Midwife

## 2022-06-25 VITALS — BP 116/76 | HR 92 | Wt 165.6 lb

## 2022-06-25 DIAGNOSIS — Z3493 Encounter for supervision of normal pregnancy, unspecified, third trimester: Secondary | ICD-10-CM

## 2022-06-25 DIAGNOSIS — Z3A38 38 weeks gestation of pregnancy: Secondary | ICD-10-CM | POA: Diagnosis not present

## 2022-06-25 DIAGNOSIS — Z2839 Other underimmunization status: Secondary | ICD-10-CM

## 2022-06-25 DIAGNOSIS — O4440 Low lying placenta NOS or without hemorrhage, unspecified trimester: Secondary | ICD-10-CM

## 2022-06-25 DIAGNOSIS — O99013 Anemia complicating pregnancy, third trimester: Secondary | ICD-10-CM

## 2022-06-25 DIAGNOSIS — Z1332 Encounter for screening for maternal depression: Secondary | ICD-10-CM | POA: Diagnosis not present

## 2022-06-25 DIAGNOSIS — O09893 Supervision of other high risk pregnancies, third trimester: Secondary | ICD-10-CM

## 2022-06-25 DIAGNOSIS — O4443 Low lying placenta NOS or without hemorrhage, third trimester: Secondary | ICD-10-CM

## 2022-06-25 NOTE — Progress Notes (Signed)
   PRENATAL VISIT NOTE  Subjective:  Samantha Peters is a 21 y.o. [id number] at 78w0dbeing seen today for ongoing prenatal care.  She is currently monitored for the following issues for this low-risk pregnancy and has Supervision of low-risk pregnancy; Rubella non-immune status, antepartum; Low-lying placenta; Cervical dysplasia; Anemia complicating pregnancy, third trimester; and Uterine contractions on their problem list.  Patient reports no complaints.  Contractions: Irregular. Vag. Bleeding: None.  Movement: Present. Denies leaking of fluid.   The following portions of the patient's history were reviewed and updated as appropriate: allergies, current medications, past family history, past medical history, past social history, past surgical history and problem list.   Objective:   Vitals:   06/25/22 0848  BP: 116/76  Pulse: 92  Weight: 165 lb 9.6 oz (75.1 kg)    Fetal Status: Fetal Heart Rate (bpm): 140 Fundal Height: 37 cm Movement: Present  Presentation: Vertex  General:  Alert, oriented and cooperative. Patient is in no acute distress.  Skin: Skin is warm and dry. No rash noted.   Cardiovascular: Normal heart rate noted  Respiratory: Normal respiratory effort, no problems with respiration noted  Abdomen: Soft, gravid, appropriate for gestational age.  Pain/Pressure: Present     Pelvic: Cervical exam performed in the presence of a chaperone Dilation: 5 Effacement (%): 70 Station: -3 (feels OP)  Extremities: Normal range of motion.     Mental Status: Normal mood and affect. Normal behavior. Normal judgment and thought content.   Assessment and Plan:  Pregnancy: [id number] 331w0d. Encounter for supervision of low-risk pregnancy in third trimester  Centering Pregnancy, Session#7: Reviewed resources in [employer]  Facilitated discussion today:  postpartum communication and postpartum mood changes Mindfulness activity with mindful listening  Fundal height and FHR  appropriate today unless noted otherwise in plan. Patient to continue group care.   In prodromal labor Discussed methods to help with labor Reviewed when to go to the hospital  2. Rubella non-immune status, antepartum MMR pp  3. Low-lying placenta Resolved  4. Anemia complicating pregnancy, third trimester Last HGB Lab Results  Component Value Date   HGB 9.1 (L) 06/22/2022    Term labor symptoms and general obstetric precautions including but not limited to vaginal bleeding, contractions, leaking of fluid and fetal movement were reviewed in detail with the patient. Please refer to After Visit Summary for other counseling recommendations.   Return in about 1 week (around 07/02/2022) for Routine prenatal care.  Future Appointments  Date Time Provider [place]12/13/2023  3:35 PM BaGriffin BasilMD [facility]12/15/2023  7:00 AM MC-LD SCWild RoseC-INDC None  07/09/2022  9:00 AM CENTERING PROVIDER [facility]  KiCaren MacadamMD

## 2022-06-26 ENCOUNTER — Encounter (HOSPITAL_COMMUNITY): Payer: Self-pay | Admitting: Obstetrics & Gynecology

## 2022-06-26 ENCOUNTER — Inpatient Hospital Stay (HOSPITAL_COMMUNITY)
Admission: AD | Admit: 2022-06-26 | Discharge: 2022-06-26 | Disposition: A | Discharge status: Home / Self Care | Payer: Medicaid Other | Attending: Obstetrics & Gynecology | Admitting: Obstetrics & Gynecology

## 2022-06-26 DIAGNOSIS — Z3A38 38 weeks gestation of pregnancy: Secondary | ICD-10-CM | POA: Insufficient documentation

## 2022-06-26 DIAGNOSIS — M549 Dorsalgia, unspecified: Secondary | ICD-10-CM | POA: Insufficient documentation

## 2022-06-26 DIAGNOSIS — O26893 Other specified pregnancy related conditions, third trimester: Secondary | ICD-10-CM | POA: Insufficient documentation

## 2022-06-26 DIAGNOSIS — O479 False labor, unspecified: Secondary | ICD-10-CM

## 2022-06-26 MED ORDER — CYCLOBENZAPRINE HCL 5 MG PO TABS
10.0000 mg | ORAL_TABLET | Freq: Once | ORAL | Status: AC
  Administered 2022-06-26: 10 mg via ORAL
  Filled 2022-06-26: qty 2

## 2022-06-26 MED ORDER — ACETAMINOPHEN 500 MG PO TABS
1000.0000 mg | ORAL_TABLET | Freq: Once | ORAL | Status: AC
  Administered 2022-06-26: 1000 mg via ORAL
  Filled 2022-06-26: qty 2

## 2022-06-26 NOTE — MAU Note (Cosign Needed)
None     Chief Complaint:  Contractions   Samantha Peters is  21 y.o. [id number] at 89w1dpresents complaining of Contractions .  She states she is having irregular contractions are associated with back and hip pain.  intact membranes, along with active fetal movement.   Obstetrical/Gynecological History: OB History     Gravida  4   Para  2   Term  2   Preterm  0   AB  1   Living  2      SAB  0   IAB  1   Ectopic      Multiple      Live Births  2          Past Medical History: Past Medical History:  Diagnosis Date   Bronchitis    Elective abortion    History of umbilical hernia 21324  Medical history non-contributory     Past Surgical History: Past Surgical History:  Procedure Laterality Date   HERNIA REPAIR      Family History: Family History  Problem Relation Age of Onset   Diabetes Mother    Hypertension Mother    Heart failure Mother    Kidney disease Mother    Hernia Father     Social History: Social History   Tobacco Use   Smoking status: Former    Types: Cigarettes, Cigars    Quit date: 11/06/2021    Years since quitting: 0.6   Smokeless tobacco: Never  Vaping Use   Vaping Use: Former   Quit date: 08/27/2021   Substances: Flavoring  Substance Use Topics   Alcohol use: No   Drug use: No    Allergies: No Known Allergies  Meds:  Medications Prior to Admission  Medication Sig Dispense Refill Last Dose   Prenatal MV & Min w/FA-DHA (PRENATAL GUMMIES PO) Take 1 tablet by mouth daily.   06/26/2022   Blood Pressure Monitoring (BLOOD PRESSURE KIT) DEVI 1 Device by Does not apply route as needed. 1 each 0    cetirizine (ZYRTEC) 10 MG tablet Take 1 tablet (10 mg total) by mouth daily. 30 tablet 1    cyclobenzaprine (FLEXERIL) 10 MG tablet Take 1 tablet (10 mg total) by mouth 2 (two) times daily as needed for muscle spasms. (Patient not taking: Reported on 06/25/2022) 60 tablet 0    ferrous sulfate (FERROUSUL) 325 (65 FE) MG tablet Take 1  tablet (325 mg total) by mouth every other day. (Patient not taking: Reported on 05/28/2022) 60 tablet 1    lidocaine (LIDODERM) 5 % Place 2 patches onto the skin daily. Remove & Discard patch within 12 hours or as directed by MD (Patient not taking: Reported on 06/25/2022) 30 patch 0    Magnesium Oxide -Mg Supplement (MAG-OXIDE) 200 MG TABS Take 2 tablets (400 mg total) by mouth at bedtime. If that amount causes loose stools in the am, switch to 2014mdaily at bedtime. (Patient not taking: Reported on 06/25/2022) 60 tablet 3    metroNIDAZOLE (FLAGYL) 500 MG tablet Take 1 tablet (500 mg total) by mouth 2 (two) times daily. (Patient not taking: Reported on 06/25/2022) 14 tablet 0    Misc. Devices (GOJJI WEIGHT SCALE) MISC 1 Device by Does not apply route as needed. 1 each 0     Review of Systems   Constitutional: Negative for fever and chills Eyes: Negative for visual disturbances Respiratory: Negative for shortness of breath, dyspnea Cardiovascular: Negative for chest pain or palpitations  Gastrointestinal:  Negative for vomiting, diarrhea and constipation Genitourinary: Negative for dysuria and urgency Musculoskeletal: Negative for back pain, joint pain, myalgias.  Normal ROM  Neurological: Negative for dizziness and headaches    Physical Exam  Blood pressure 127/72, pulse 99, temperature 98.1 F (36.7 C), resp. rate 17, height _0  (1.549 m), weight 74.4 kg, last menstrual period 10/02/2021, SpO2 100 %, unknown if currently breastfeeding. GENERAL: Well-developed, well-nourished female in no acute distress.  LUNGS: Normal respiratory effort HEART: Regular rate and rhythm. ABDOMEN: Soft, nontender, nondistended, gravid.  EXTREMITIES: Nontender, no edema, 2+ distal pulses. DTR's 2+ CERVICAL EXAM: Dilatation 4cm   Effacement 50%   Station -2  X 2 checks, same as last week   Presentation: cephalic FHT:  Baseline rate 140 bpm   Variability moderate  Accelerations present   Decelerations  none Contractions: Every 0 mins   Labs: No results found for this or any previous visit (from the past 24 hour(s)). Imaging Studies:    Assessment: Samantha Peters is  21 y.o. [phone] at 61w1dpresents with back pain, no evidence of labor.  Plan: Flexaril, tylenol.   Spinning Babies/miles circuit exercises  FChristin Fudge12/7/202310:55 PM

## 2022-06-26 NOTE — MAU Note (Signed)
.  Samantha Peters is a 21 y.o. at [redacted]w[redacted]d here in MAU reporting strong ctxs since 1800. Scant bloody show. Denies LOF. Reports good FM. Was 5cm yesterday  Onset of complaint: 1800 Pain score: 8 Vitals:   06/26/22 2120  Pulse: (!) 107  Resp: 17  Temp: 98.1 F (36.7 C)  SpO2: 100%     FHT:145 Lab orders placed from triage:   Mau labor eval

## 2022-06-26 NOTE — Discharge Instructions (Signed)
Google "Musician Babies" for stretches, etc

## 2022-06-27 ENCOUNTER — Telehealth (HOSPITAL_COMMUNITY): Payer: Self-pay | Admitting: *Deleted

## 2022-06-27 ENCOUNTER — Other Ambulatory Visit (HOSPITAL_COMMUNITY): Payer: Self-pay

## 2022-06-27 NOTE — Telephone Encounter (Signed)
Preadmission screen  

## 2022-06-30 ENCOUNTER — Telehealth (HOSPITAL_COMMUNITY): Payer: Self-pay | Admitting: *Deleted

## 2022-06-30 NOTE — Telephone Encounter (Signed)
Preadmission screen  

## 2022-07-01 ENCOUNTER — Other Ambulatory Visit (HOSPITAL_COMMUNITY): Payer: Self-pay

## 2022-07-01 ENCOUNTER — Telehealth (HOSPITAL_COMMUNITY): Payer: Self-pay | Admitting: *Deleted

## 2022-07-01 NOTE — Telephone Encounter (Signed)
Preadmission screen  

## 2022-07-02 ENCOUNTER — Encounter (HOSPITAL_COMMUNITY): Payer: Self-pay | Admitting: *Deleted

## 2022-07-02 ENCOUNTER — Other Ambulatory Visit: Payer: Self-pay | Admitting: Advanced Practice Midwife

## 2022-07-02 ENCOUNTER — Encounter: Payer: Medicaid Other | Admitting: Obstetrics and Gynecology

## 2022-07-02 ENCOUNTER — Other Ambulatory Visit (HOSPITAL_COMMUNITY): Payer: Self-pay

## 2022-07-02 ENCOUNTER — Telehealth (HOSPITAL_COMMUNITY): Payer: Self-pay | Admitting: *Deleted

## 2022-07-02 DIAGNOSIS — Z3490 Encounter for supervision of normal pregnancy, unspecified, unspecified trimester: Secondary | ICD-10-CM

## 2022-07-02 NOTE — Telephone Encounter (Signed)
Preadmission screen  

## 2022-07-04 ENCOUNTER — Inpatient Hospital Stay (HOSPITAL_COMMUNITY)
Admission: RE | Admit: 2022-07-04 | Payer: Medicaid Other | Source: Home / Self Care | Admitting: Obstetrics and Gynecology

## 2022-07-04 ENCOUNTER — Inpatient Hospital Stay (HOSPITAL_COMMUNITY): Payer: Medicaid Other

## 2022-07-04 ENCOUNTER — Inpatient Hospital Stay (HOSPITAL_COMMUNITY)
Admission: AD | Admit: 2022-07-04 | Discharge: 2022-07-07 | DRG: 807 | Disposition: A | Discharge status: Home / Self Care | Payer: Medicaid Other | Attending: Obstetrics and Gynecology | Admitting: Obstetrics and Gynecology

## 2022-07-04 ENCOUNTER — Encounter (HOSPITAL_COMMUNITY): Payer: Self-pay | Admitting: Obstetrics and Gynecology

## 2022-07-04 DIAGNOSIS — Z3A39 39 weeks gestation of pregnancy: Secondary | ICD-10-CM

## 2022-07-04 DIAGNOSIS — O99013 Anemia complicating pregnancy, third trimester: Secondary | ICD-10-CM | POA: Diagnosis present

## 2022-07-04 DIAGNOSIS — Z87410 Personal history of cervical dysplasia: Secondary | ICD-10-CM

## 2022-07-04 DIAGNOSIS — O9902 Anemia complicating childbirth: Principal | ICD-10-CM | POA: Diagnosis present

## 2022-07-04 DIAGNOSIS — Z3490 Encounter for supervision of normal pregnancy, unspecified, unspecified trimester: Secondary | ICD-10-CM

## 2022-07-04 DIAGNOSIS — O09899 Supervision of other high risk pregnancies, unspecified trimester: Secondary | ICD-10-CM

## 2022-07-04 DIAGNOSIS — Z3493 Encounter for supervision of normal pregnancy, unspecified, third trimester: Secondary | ICD-10-CM

## 2022-07-04 DIAGNOSIS — Z23 Encounter for immunization: Secondary | ICD-10-CM

## 2022-07-04 DIAGNOSIS — Z87891 Personal history of nicotine dependence: Secondary | ICD-10-CM

## 2022-07-04 DIAGNOSIS — O479 False labor, unspecified: Secondary | ICD-10-CM | POA: Diagnosis present

## 2022-07-04 MED ORDER — NALBUPHINE HCL 10 MG/ML IJ SOLN: 20.0000 mg | Freq: Once | INTRAMUSCULAR | Status: DC

## 2022-07-04 MED ORDER — OXYCODONE-ACETAMINOPHEN 5-325 MG PO TABS
2.0000 | ORAL_TABLET | Freq: Once | ORAL | Status: AC
  Administered 2022-07-04: 2 via ORAL
  Filled 2022-07-04: qty 2

## 2022-07-04 MED ORDER — LACTATED RINGERS IV SOLN: INTRAVENOUS | Status: DC

## 2022-07-04 MED ORDER — PROMETHAZINE HCL 25 MG PO TABS
25.0000 mg | ORAL_TABLET | Freq: Once | ORAL | Status: AC
  Administered 2022-07-04: 25 mg via ORAL
  Filled 2022-07-04: qty 1

## 2022-07-04 NOTE — MAU Provider Note (Signed)
Chief Complaint:  Contractions   Event Date/Time   First Provider Initiated Contact with Patient 07/04/22 2130     HPI  HPI: Samantha Peters is a 21 y.o. [id number] at 83w2dho presents to maternity admissions reporting ***. She reports good fetal movement, denies LOF, vaginal bleeding, vaginal itching/burning, urinary symptoms, h/a, dizziness, n/v, diarrhea, constipation or fever/chills.  She denies headache, visual changes or RUQ abdominal pain.   Past Medical History: Past Medical History:  Diagnosis Date   Bronchitis    Elective abortion    History of umbilical hernia 22330  Medical history non-contributory     Past obstetric history: OB History  Gravida Para Term Preterm AB Living  _0 0 1 2  SAB IAB Ectopic Multiple Live Births  0 1     2    # Outcome Date GA Lbr Len/2nd Weight Sex Delivery Anes PTL Lv  4 Current           3 IAB 04/2021          2 Term 07/09/16 [id number] g F Vag-Spont EPI Y LIV     Birth Comments: was told was preterm  1 Term 12/28/14 [id number] g M Vag-Spont EPI Y LIV     Birth Comments: wnl    Past Surgical History: Past Surgical History:  Procedure Laterality Date   HERNIA REPAIR      Family History: Family History  Problem Relation Age of Onset   Diabetes Mother    Hypertension Mother    Heart failure Mother    Kidney disease Mother    Hernia Father     Social History: Social History   Tobacco Use   Smoking status: Former    Types: Cigarettes, Cigars    Quit date: 11/06/2021    Years since quitting: 0.6   Smokeless tobacco: Never  Vaping Use   Vaping Use: Former   Quit date: 08/27/2021   Substances: Flavoring  Substance Use Topics   Alcohol use: No   Drug use: No    Allergies: No Known Allergies  Meds:  Medications Prior to Admission  Medication Sig Dispense Refill Last Dose   Prenatal MV & Min w/FA-DHA (PRENATAL GUMMIES PO) Take 1 tablet by mouth daily.   07/04/2022   Blood Pressure Monitoring (BLOOD PRESSURE KIT)  DEVI 1 Device by Does not apply route as needed. 1 each 0    cetirizine (ZYRTEC) 10 MG tablet Take 1 tablet (10 mg total) by mouth daily. 30 tablet 1    cyclobenzaprine (FLEXERIL) 10 MG tablet Take 1 tablet (10 mg total) by mouth 2 (two) times daily as needed for muscle spasms. (Patient not taking: Reported on 06/25/2022) 60 tablet 0    ferrous sulfate (FERROUSUL) 325 (65 FE) MG tablet Take 1 tablet (325 mg total) by mouth every other day. (Patient not taking: Reported on 05/28/2022) 60 tablet 1    lidocaine (LIDODERM) 5 % Place 2 patches onto the skin daily. Remove & Discard patch within 12 hours or as directed by MD (Patient not taking: Reported on 06/25/2022) 30 patch 0    Magnesium Oxide -Mg Supplement (MAG-OXIDE) 200 MG TABS Take 2 tablets (400 mg total) by mouth at bedtime. If that amount causes loose stools in the am, switch to 200m49mily at bedtime. (Patient not taking: Reported on 06/25/2022) 60 tablet 3    metroNIDAZOLE (FLAGYL) 500 MG tablet Take 1 tablet (500 mg total) by mouth 2 (two) times daily. (Patient  not taking: Reported on 06/25/2022) 14 tablet 0    Misc. Devices (GOJJI WEIGHT SCALE) MISC 1 Device by Does not apply route as needed. 1 each 0     I have reviewed patient's Past Medical Hx, Surgical Hx, Family Hx, Social Hx, medications and allergies.   ROS:  Review of Systems Other systems negative  Physical Exam  Patient Vitals for the past 24 hrs:  BP Temp Temp src Pulse Resp SpO2 Height Weight  07/04/22 2110 -- -- -- -- -- 99 % -- --  07/04/22 2105 -- -- -- -- -- 100 % -- --  07/04/22 2100 -- -- -- -- -- 98 % -- --  07/04/22 2055 -- -- -- -- -- 97 % -- --  07/04/22 2050 -- -- -- -- -- 99 % -- --  07/04/22 2046 131/81 -- -- (!) 103 -- -- -- --  07/04/22 2035 121/73 98.1 F (36.7 C) Oral (!) 109 16 -- _0  (1.549 m) 73.1 kg   Constitutional: Well-developed, well-nourished female in no acute distress.  Cardiovascular: normal rate and rhythm Respiratory: normal effort,  clear to auscultation bilaterally GI: Abd soft, non-tender, gravid appropriate for gestational age.   No rebound or guarding. MS: Extremities nontender, no edema, normal ROM Neurologic: Alert and oriented x 4.  GU: Neg CVAT.  PELVIC EXAM: Cervix pink, visually closed, without lesion, scant white creamy discharge, vaginal walls and external genitalia normal Bimanual exam: Cervix firm, posterior, neg CMT, uterus nontender, Fundal Height consistent with dates, adnexa without tenderness, enlargement, or mass  Dilation: 4 Effacement (%): 50 Cervical Position: Middle Station: -2 Presentation: Vertex Exam by:: weston,rn  FHT:  Baseline *** , moderate variability, accelerations present, no decelerations Contractions: q *** mins Irregular  Rare   Labs: No results found for this or any previous visit (from the past 24 hour(s)). --/--/B POS (12/01 0208)  Imaging:  No results found.  MAU Course/MDM: I have reviewed the triage vital signs and the nursing notes.   Pertinent labs & imaging results that were available during my care of the patient were reviewed by me and considered in my medical decision making (see chart for details).      I have reviewed her medical records including past results, notes and treatments.   I have ordered labs and reviewed results.  NST reviewed Consult *** with presentation, exam findings and test results.  Treatments in MAU included ***.    Assessment: 1. Encounter for supervision of low-risk pregnancy in third trimester     Plan: Discharge home Labor precautions and fetal kick counts Follow up in Office for prenatal visits and recheck   Pt stable at time of discharge.  Hansel Feinstein CNM, MSN Certified Nurse-Midwife 07/04/2022 9:30 PM

## 2022-07-04 NOTE — MAU Note (Signed)
Pt says UC's strong since  [facility]- clinic Missed her last appointment  Before was 5 cm- but came here and 4 cm Is an induction for today  Denies HSV GBS- neg

## 2022-07-05 ENCOUNTER — Inpatient Hospital Stay (HOSPITAL_COMMUNITY): Payer: Medicaid Other | Admitting: Anesthesiology

## 2022-07-05 ENCOUNTER — Other Ambulatory Visit: Payer: Self-pay

## 2022-07-05 ENCOUNTER — Encounter (HOSPITAL_COMMUNITY): Payer: Self-pay | Admitting: Obstetrics and Gynecology

## 2022-07-05 DIAGNOSIS — O479 False labor, unspecified: Secondary | ICD-10-CM

## 2022-07-05 DIAGNOSIS — O9902 Anemia complicating childbirth: Secondary | ICD-10-CM | POA: Diagnosis present

## 2022-07-05 DIAGNOSIS — O26893 Other specified pregnancy related conditions, third trimester: Secondary | ICD-10-CM | POA: Diagnosis present

## 2022-07-05 DIAGNOSIS — Z87410 Personal history of cervical dysplasia: Secondary | ICD-10-CM | POA: Diagnosis not present

## 2022-07-05 DIAGNOSIS — Z3A39 39 weeks gestation of pregnancy: Secondary | ICD-10-CM

## 2022-07-05 DIAGNOSIS — Z3490 Encounter for supervision of normal pregnancy, unspecified, unspecified trimester: Secondary | ICD-10-CM | POA: Diagnosis present

## 2022-07-05 DIAGNOSIS — Z23 Encounter for immunization: Secondary | ICD-10-CM | POA: Diagnosis not present

## 2022-07-05 DIAGNOSIS — Z87891 Personal history of nicotine dependence: Secondary | ICD-10-CM | POA: Diagnosis not present

## 2022-07-05 LAB — CBC
HCT: 33.8 % — ABNORMAL LOW (ref 36.0–46.0)
Hemoglobin: 10.9 g/dL — ABNORMAL LOW (ref 12.0–15.0)
MCH: 27 pg (ref 26.0–34.0)
MCHC: 32.2 g/dL (ref 30.0–36.0)
MCV: 83.9 fL (ref 80.0–100.0)
Platelets: 356 10*3/uL (ref 150–400)
RBC: 4.03 MIL/uL (ref 3.87–5.11)
RDW: 14.6 % (ref 11.5–15.5)
WBC: 16 10*3/uL — ABNORMAL HIGH (ref 4.0–10.5)
nRBC: 0 % (ref 0.0–0.2)

## 2022-07-05 LAB — TYPE AND SCREEN
ABO/RH(D): B POS
Antibody Screen: NEGATIVE

## 2022-07-05 LAB — RPR: RPR Ser Ql: NONREACTIVE

## 2022-07-05 MED ORDER — OXYTOCIN-SODIUM CHLORIDE 30-0.9 UT/500ML-% IV SOLN
1.0000 m[IU]/min | INTRAVENOUS | Status: DC
  Administered 2022-07-05: 2 m[IU]/min via INTRAVENOUS
  Filled 2022-07-05: qty 500

## 2022-07-05 MED ORDER — ONDANSETRON HCL 4 MG PO TABS: 4.0000 mg | ORAL_TABLET | ORAL | Status: DC | PRN

## 2022-07-05 MED ORDER — PHENYLEPHRINE 80 MCG/ML (10ML) SYRINGE FOR IV PUSH (FOR BLOOD PRESSURE SUPPORT): 80.0000 ug | PREFILLED_SYRINGE | INTRAVENOUS | Status: DC | PRN

## 2022-07-05 MED ORDER — FENTANYL-BUPIVACAINE-NACL 0.5-0.125-0.9 MG/250ML-% EP SOLN
12.0000 mL/h | EPIDURAL | Status: DC | PRN
  Administered 2022-07-05: 12 mL/h via EPIDURAL
  Filled 2022-07-05: qty 250

## 2022-07-05 MED ORDER — DIPHENHYDRAMINE HCL 50 MG/ML IJ SOLN: 12.5000 mg | INTRAMUSCULAR | Status: DC | PRN

## 2022-07-05 MED ORDER — METHYLERGONOVINE MALEATE 0.2 MG/ML IJ SOLN: 0.2000 mg | INTRAMUSCULAR | Status: DC | PRN

## 2022-07-05 MED ORDER — ONDANSETRON HCL 4 MG/2ML IJ SOLN: 4.0000 mg | Freq: Four times a day (QID) | INTRAMUSCULAR | Status: DC | PRN

## 2022-07-05 MED ORDER — WITCH HAZEL-GLYCERIN EX PADS: 1.0000 | MEDICATED_PAD | CUTANEOUS | Status: DC | PRN

## 2022-07-05 MED ORDER — DIPHENHYDRAMINE HCL 50 MG/ML IJ SOLN
12.5000 mg | Freq: Two times a day (BID) | INTRAMUSCULAR | Status: DC | PRN
  Administered 2022-07-05: 12.5 mg via INTRAVENOUS
  Filled 2022-07-05: qty 1

## 2022-07-05 MED ORDER — OXYTOCIN BOLUS FROM INFUSION
333.0000 mL | Freq: Once | INTRAVENOUS | Status: AC
  Administered 2022-07-05: 333 mL via INTRAVENOUS

## 2022-07-05 MED ORDER — PRENATAL MULTIVITAMIN CH
1.0000 | ORAL_TABLET | Freq: Every day | ORAL | Status: DC
  Administered 2022-07-05 – 2022-07-06 (×2): 1 via ORAL
  Filled 2022-07-05 (×2): qty 1

## 2022-07-05 MED ORDER — IBUPROFEN 600 MG PO TABS
600.0000 mg | ORAL_TABLET | Freq: Four times a day (QID) | ORAL | Status: DC
  Administered 2022-07-05 – 2022-07-07 (×8): 600 mg via ORAL
  Filled 2022-07-05 (×8): qty 1

## 2022-07-05 MED ORDER — OXYCODONE-ACETAMINOPHEN 5-325 MG PO TABS
1.0000 | ORAL_TABLET | Freq: Four times a day (QID) | ORAL | Status: DC | PRN
  Administered 2022-07-05 – 2022-07-06 (×4): 2 via ORAL
  Filled 2022-07-05 (×5): qty 2

## 2022-07-05 MED ORDER — DIPHENHYDRAMINE HCL 25 MG PO CAPS: 25.0000 mg | ORAL_CAPSULE | Freq: Four times a day (QID) | ORAL | Status: DC | PRN

## 2022-07-05 MED ORDER — FENTANYL-BUPIVACAINE-NACL 0.5-0.125-0.9 MG/250ML-% EP SOLN: 12.0000 mL/h | EPIDURAL | Status: DC | PRN

## 2022-07-05 MED ORDER — OXYCODONE-ACETAMINOPHEN 5-325 MG PO TABS: 1.0000 | ORAL_TABLET | ORAL | Status: DC | PRN

## 2022-07-05 MED ORDER — BENZOCAINE-MENTHOL 20-0.5 % EX AERO
1.0000 | INHALATION_SPRAY | CUTANEOUS | Status: DC | PRN
  Filled 2022-07-05: qty 56

## 2022-07-05 MED ORDER — SODIUM CHLORIDE 0.9 % IV SOLN: INTRAVENOUS | Status: DC | PRN

## 2022-07-05 MED ORDER — EPHEDRINE 5 MG/ML INJ: 10.0000 mg | INTRAVENOUS | Status: DC | PRN

## 2022-07-05 MED ORDER — METHYLERGONOVINE MALEATE 0.2 MG PO TABS
0.2000 mg | ORAL_TABLET | ORAL | Status: DC | PRN
  Administered 2022-07-05 (×2): 0.2 mg via ORAL
  Filled 2022-07-05 (×2): qty 1

## 2022-07-05 MED ORDER — SOD CITRATE-CITRIC ACID 500-334 MG/5ML PO SOLN: 30.0000 mL | ORAL | Status: DC | PRN

## 2022-07-05 MED ORDER — LIDOCAINE HCL (PF) 1 % IJ SOLN: 30.0000 mL | INTRAMUSCULAR | Status: DC | PRN

## 2022-07-05 MED ORDER — SODIUM CHLORIDE 0.9% FLUSH: 3.0000 mL | INTRAVENOUS | Status: DC | PRN

## 2022-07-05 MED ORDER — LIDOCAINE HCL (PF) 1 % IJ SOLN
INTRAMUSCULAR | Status: DC | PRN
  Administered 2022-07-05: 10 mL via EPIDURAL

## 2022-07-05 MED ORDER — TETANUS-DIPHTH-ACELL PERTUSSIS 5-2.5-18.5 LF-MCG/0.5 IM SUSY: 0.5000 mL | PREFILLED_SYRINGE | Freq: Once | INTRAMUSCULAR | Status: DC

## 2022-07-05 MED ORDER — FENTANYL CITRATE (PF) 100 MCG/2ML IJ SOLN
100.0000 ug | INTRAMUSCULAR | Status: DC | PRN
  Administered 2022-07-05 (×2): 100 ug via INTRAVENOUS
  Filled 2022-07-05 (×2): qty 2

## 2022-07-05 MED ORDER — OXYCODONE-ACETAMINOPHEN 5-325 MG PO TABS: 2.0000 | ORAL_TABLET | ORAL | Status: DC | PRN

## 2022-07-05 MED ORDER — MEASLES, MUMPS & RUBELLA VAC IJ SOLR
0.5000 mL | Freq: Once | INTRAMUSCULAR | Status: AC
  Administered 2022-07-07: 0.5 mL via SUBCUTANEOUS
  Filled 2022-07-05: qty 0.5

## 2022-07-05 MED ORDER — LACTATED RINGERS IV SOLN
500.0000 mL | Freq: Once | INTRAVENOUS | Status: AC
  Administered 2022-07-05: 500 mL via INTRAVENOUS

## 2022-07-05 MED ORDER — TERBUTALINE SULFATE 1 MG/ML IJ SOLN: 0.2500 mg | Freq: Once | INTRAMUSCULAR | Status: DC | PRN

## 2022-07-05 MED ORDER — DIBUCAINE (PERIANAL) 1 % EX OINT: 1.0000 | TOPICAL_OINTMENT | CUTANEOUS | Status: DC | PRN

## 2022-07-05 MED ORDER — ONDANSETRON HCL 4 MG/2ML IJ SOLN: 4.0000 mg | INTRAMUSCULAR | Status: DC | PRN

## 2022-07-05 MED ORDER — ACETAMINOPHEN 325 MG PO TABS
650.0000 mg | ORAL_TABLET | ORAL | Status: DC | PRN
  Administered 2022-07-06: 650 mg via ORAL
  Filled 2022-07-05: qty 2

## 2022-07-05 MED ORDER — OXYTOCIN-SODIUM CHLORIDE 30-0.9 UT/500ML-% IV SOLN: 2.5000 [IU]/h | INTRAVENOUS | Status: DC

## 2022-07-05 MED ORDER — COCONUT OIL OIL
1.0000 | TOPICAL_OIL | Status: DC | PRN
  Administered 2022-07-05: 1 via TOPICAL

## 2022-07-05 MED ORDER — ACETAMINOPHEN 325 MG PO TABS
650.0000 mg | ORAL_TABLET | ORAL | Status: DC | PRN
  Administered 2022-07-05: 650 mg via ORAL
  Filled 2022-07-05: qty 2

## 2022-07-05 MED ORDER — SENNOSIDES-DOCUSATE SODIUM 8.6-50 MG PO TABS
2.0000 | ORAL_TABLET | ORAL | Status: DC
  Administered 2022-07-05 – 2022-07-06 (×2): 2 via ORAL
  Filled 2022-07-05 (×3): qty 2

## 2022-07-05 MED ORDER — ZOLPIDEM TARTRATE 5 MG PO TABS: 5.0000 mg | ORAL_TABLET | Freq: Every evening | ORAL | Status: DC | PRN

## 2022-07-05 MED ORDER — LACTATED RINGERS IV SOLN: 500.0000 mL | INTRAVENOUS | Status: DC | PRN

## 2022-07-05 MED ORDER — SODIUM CHLORIDE 0.9% FLUSH
3.0000 mL | Freq: Two times a day (BID) | INTRAVENOUS | Status: DC
  Administered 2022-07-06: 3 mL via INTRAVENOUS

## 2022-07-05 MED ORDER — SIMETHICONE 80 MG PO CHEW: 80.0000 mg | CHEWABLE_TABLET | ORAL | Status: DC | PRN

## 2022-07-05 NOTE — Lactation Note (Addendum)
This note was copied from a baby's chart. Lactation Consultation Note  Patient Name: Samantha Peters Date: 07/05/2022 Reason for consult: Initial assessment;Term;Infant < 6lbs Age:21 years old   P3: Term infant at 39+3 weeks Feeding preference: Breast/formula                                   Plans to formula feed after discharge Initial blood sugar was 68 mg/dl.  "Kha'laya" was in visitor's lap when I arrived; family was face timing.  Reviewed breast feeding basics with mother.  She is able to hand express colostrum.  Demonstrated finger feeding/spoon feeding colostrum to baby.  Encouraged mother to latch with every feeding prior to giving formula supplementation.  Asked mother to feed her at least every three hours or sooner if she shows feeding cues.  Suggested she call her RN/LC for latch assistance as needed.    Mother does not have an electric pump for home use, however, if she is not planning on breast feeding or providing her expressed milk, there is no indication that a pump will be necessary.  RN updated.   Maternal Data Has patient been taught Hand Expression?: Yes Does the patient have breastfeeding experience prior to this delivery?: Yes How long did the patient breastfeed?: 1 day with each of her other two children  Feeding Mother's Current Feeding Choice: Breast Milk and Formula  LATCH Score Latch: Repeated attempts needed to sustain latch, nipple held in mouth throughout feeding, stimulation needed to elicit sucking reflex.  Audible Swallowing: A few with stimulation  Type of Nipple: Everted at rest and after stimulation  Comfort (Breast/Nipple): Soft / non-tender  Hold (Positioning): No assistance needed to correctly position infant at breast.  LATCH Score: 8   Lactation Tools Discussed/Used    Interventions Interventions: Education;LC Services brochure  Discharge    Consult Status Consult Status: Follow-up Date: 07/06/22 Follow-up type:  In-patient    Coleston Dirosa R Perley Arthurs 07/05/2022, 9:05 AM

## 2022-07-05 NOTE — Lactation Note (Signed)
This note was copied from a baby's chart. Lactation Consultation Note  Patient Name: Samantha Peters IRCVE'L Date: 07/05/2022 Reason for consult: Follow-up assessment Age:21 years   P3: Term infant at 39+3 weeks Feeding preference: Breast/formula                                   Plans to formula feed after discharge  Blood sugars: 68 mg/dl and 56 mg/dl  Mother requested latch assistance.  Reviewed breast massage and hand expression.  Assisted to latch easily in the cross cradle hold.  With gentle stimulation, observed "Samantha Peters" feeding for 12 minutes.  Mother asked to be done with breast feeding due to strong uterine contractions.  Removed baby from the breast and mother in too much discomfort to bottle feed baby.  Asked grandmother to continue the feeding; grandmother willing.  Encouraged to continue feeding at least every three hours and to call for latch assistance as needed.  Mother verbalized understanding.  RN updated and provided pain medication prior to breast feeding.   Maternal Data Has patient been taught Hand Expression?: Yes Does the patient have breastfeeding experience prior to this delivery?: Yes How long did the patient breastfeed?: 1 day with each of her other two children  Feeding Mother's Current Feeding Choice: Breast Milk and Formula  LATCH Score Latch: Grasps breast easily, tongue down, lips flanged, rhythmical sucking.  Audible Swallowing: A few with stimulation  Type of Nipple: Everted at rest and after stimulation  Comfort (Breast/Nipple): Soft / non-tender  Hold (Positioning): Assistance needed to correctly position infant at breast and maintain latch.  LATCH Score: 8   Lactation Tools Discussed/Used    Interventions Interventions: Breast feeding basics reviewed;Assisted with latch;Skin to skin;Breast massage;Hand express;Breast compression;Position options;Support pillows;Adjust position;Education  Discharge    Consult  Status Consult Status: Follow-up Date: 07/06/22 Follow-up type: In-patient    Odaly Peri R Danyetta Gillham 07/05/2022, 11:46 AM

## 2022-07-05 NOTE — Progress Notes (Signed)
RN called Dr. Debroah Loop to clarify methergine order.  RN gave first dose, asked Dr. Debroah Loop if he would like it to be scheduled instead of PRN.  Dr. Sandria Bales he would like it to stay PRN as long as pt bleeding is appropriate.

## 2022-07-05 NOTE — Anesthesia Postprocedure Evaluation (Signed)
Anesthesia Post Note  Patient: Samantha Peters  Procedure(s) Performed: AN AD HOC LABOR EPIDURAL     Patient location during evaluation: Mother Baby Anesthesia Type: Epidural Level of consciousness: awake Pain management: satisfactory to patient Vital Signs Assessment: post-procedure vital signs reviewed and stable Respiratory status: spontaneous breathing Cardiovascular status: stable Anesthetic complications: no   No notable events documented.  Last Vitals:  Vitals:   07/05/22 0745 07/05/22 0845  BP: 127/79 123/74  Pulse: 89 93  Resp: 20 20  Temp: 37.1 C 36.9 C  SpO2: 99% 98%    Last Pain:  Vitals:   07/05/22 0845  TempSrc: Oral  PainSc: 0-No pain   Pain Goal:                   [employer]

## 2022-07-05 NOTE — Anesthesia Procedure Notes (Signed)
Epidural Patient location during procedure: OB Start time: 07/05/2022 3:40 AM End time: 07/05/2022 3:45 AM  Staffing Anesthesiologist: Bethena Midget, MD  Preanesthetic Checklist Completed: patient identified, IV checked, site marked, risks and benefits discussed, surgical consent, monitors and equipment checked, pre-op evaluation and timeout performed  Epidural Patient position: sitting Prep: DuraPrep and site prepped and draped Patient monitoring: continuous pulse ox and blood pressure Approach: midline Location: L3-L4 Injection technique: LOR air  Needle:  Needle type: Tuohy  Needle gauge: 17 G Needle length: 9 cm and 9 Needle insertion depth: 6 cm Catheter type: closed end flexible Catheter size: 19 Gauge Catheter at skin depth: 12 cm Test dose: negative  Assessment Events: blood not aspirated, no cerebrospinal fluid, injection not painful, no injection resistance, no paresthesia and negative IV test

## 2022-07-05 NOTE — Progress Notes (Signed)
Patient complains of increased abdominal pain and patient states " I  feel like blood is pouring out".  Fundal rubs were firm after massage ,large blood clots that are streaky are ejected from the vagina.  Triton is used to calculate blood lose.  Blood lose is 260.  MD notified of  and MD states that he is own his way up to evaluate the patient.  Will continue to monitor.    Darrick Grinder, RN

## 2022-07-05 NOTE — Anesthesia Preprocedure Evaluation (Signed)
Anesthesia Evaluation  Patient identified by MRN, date of birth, ID band Patient awake    Reviewed: Allergy & Precautions, H&P , NPO status , Patient's Chart, lab work & pertinent test results, reviewed documented beta blocker date and time   Airway Mallampati: I  TM Distance: >3 FB Neck ROM: full    Dental no notable dental hx. (+) Teeth Intact, Dental Advisory Given   Pulmonary neg pulmonary ROS, former smoker   Pulmonary exam normal breath sounds clear to auscultation       Cardiovascular negative cardio ROS Normal cardiovascular exam Rhythm:regular Rate:Normal     Neuro/Psych negative neurological ROS  negative psych ROS   GI/Hepatic negative GI ROS, Neg liver ROS,,,  Endo/Other  negative endocrine ROS    Renal/GU negative Renal ROS  negative genitourinary   Musculoskeletal   Abdominal   Peds  Hematology negative hematology ROS (+) Blood dyscrasia, anemia   Anesthesia Other Findings   Reproductive/Obstetrics (+) Pregnancy                             Anesthesia Physical Anesthesia Plan  ASA: 2  Anesthesia Plan: Epidural   Post-op Pain Management: Minimal or no pain anticipated   Induction:   PONV Risk Score and Plan: 2  Airway Management Planned: Natural Airway  Additional Equipment: None  Intra-op Plan:   Post-operative Plan:   Informed Consent: I have reviewed the patients History and Physical, chart, labs and discussed the procedure including the risks, benefits and alternatives for the proposed anesthesia with the patient or authorized representative who has indicated his/her understanding and acceptance.     Dental Advisory Given  Plan Discussed with: Anesthesiologist  Anesthesia Plan Comments: (Labs checked- platelets confirmed with RN in room. Fetal heart tracing, per RN, reported to be stable enough for sitting procedure. Discussed epidural, and patient  consents to the procedure:  included risk of possible headache,backache, failed block, allergic reaction, and nerve injury. This patient was asked if she had any questions or concerns before the procedure started.)       Anesthesia Quick Evaluation

## 2022-07-05 NOTE — Discharge Summary (Signed)
Postpartum Discharge Summary  Date of Service updated***     Patient Name: Samantha Peters DOB: 05-Apr-2001 MRN: [id number]  Date of admission: 07/04/2022 Delivery date:07/05/2022  Delivering provider: Stormy Card  Date of discharge: 07/05/2022  Admitting diagnosis: Encounter for elective induction of labor [Z34.90] Intrauterine pregnancy: [redacted]w[redacted]d    Secondary diagnosis:  Principal Problem:   Encounter for elective induction of labor Active Problems:   Supervision of low-risk pregnancy   Rubella non-immune status, antepartum   Anemia complicating pregnancy, third trimester   Uterine contractions  Additional problems: ***    Discharge diagnosis: {DX.:23714}                                              Post partum procedures:{Postpartum procedures:23558} Augmentation: AROM Complications: None  Hospital course: Onset of Labor With Vaginal Delivery      21y.o. yo [id number] admitted in Latent Labor on 07/04/2022. Labor course was complicated by none  Membrane Rupture Time/Date: 2:50 AM ,07/05/2022   Delivery Method:Vaginal, Spontaneous  Episiotomy: None  Lacerations:  None  Patient had a postpartum course complicated by ***.  She is ambulating, tolerating a regular diet, passing flatus, and urinating well. Patient is discharged home in stable condition on 07/05/22.  Newborn Data: Birth date:07/05/2022  Birth time:5:22 AM  Gender:Female  Living status:Living  Apgars:9 ,9  Weight:   Magnesium Sulfate received: No BMZ received: No Rhophylac:N/A MMR:{MMR:30440033} T-DaP:{Tdap:23962} Flu: {F{YZJ:09643}ransfusion:{Transfusion received:30440034}  Physical exam  Vitals:   07/05/22 0420 07/05/22 0430 07/05/22 0530 07/05/22 0545  BP: (!) 114/97 (!) 112/52 128/64 122/61  Pulse: 96 82 96 95  Resp:      Temp:      TempSrc:      SpO2:      Weight:      Height:       General: {Exam; general:21111117} Lochia: {Desc;  appropriate/inappropriate:30686::"appropriate"} Uterine Fundus: {Desc; firm/soft:30687} Incision: {Exam; incision:21111123} DVT Evaluation: {Exam; dvt:2111122} Labs: Lab Results  Component Value Date   WBC 16.0 (H) 07/04/2022   HGB 10.9 (L) 07/04/2022   HCT 33.8 (L) 07/04/2022   MCV 83.9 07/04/2022   PLT 356 07/04/2022      Latest Ref Rng & Units 12/17/2021    3:12 PM  CMP  Glucose 70 - 99 mg/dL 88   BUN 6 - 20 mg/dL 7   Creatinine 0.44 - 1.00 mg/dL 0.59   Sodium 135 - 145 mmol/L 134   Potassium 3.5 - 5.1 mmol/L 3.6   Chloride 98 - 111 mmol/L 102   CO2 22 - 32 mmol/L 24   Calcium 8.9 - 10.3 mg/dL 9.4   Total Protein 6.5 - 8.1 g/dL 7.0   Total Bilirubin 0.3 - 1.2 mg/dL 0.5   Alkaline Phos 38 - 126 U/L 45   AST 15 - 41 U/L 15   ALT 0 - 44 U/L 13    Edinburgh Score:     No data to display           After visit meds:  Allergies as of 07/05/2022   No Known Allergies   Med Rec must be completed prior to using this [facility]*        Discharge home in stable condition Infant Feeding: {Baby feeding:23562} Infant Disposition:{CHL IP OB HOME WITH MOCVKFMM:03754}ischarge instruction: per After Visit Summary and Postpartum  booklet. Activity: Advance as tolerated. Pelvic rest for 6 weeks.  Diet: {OB PCHE:03524818} Future Appointments: Future Appointments  Date Time Provider [place]  07/09/2022  9:00 AM CENTERING PROVIDER [facility]   Follow up Visit:  The following message was sent to [facility] by Mikki Santee, MD  Please schedule this patient for a Virtual postpartum visit in 6 weeks with the following provider: Any provider. Additional Postpartum F/U: none   Low risk pregnancy complicated by: Anemia Delivery mode:  Vaginal, Spontaneous  Anticipated Birth Control:  Depo   07/05/2022 Selim Durden Sherrilyn Rist, MD

## 2022-07-05 NOTE — H&P (Signed)
OBSTETRIC ADMISSION HISTORY AND PHYSICAL  Samantha Peters is a 21 y.o. female [phone] with IUP at 50w3dby LMP presenting for uterine contractions which are painful. Noted to be making progress in labor in MAU. She is planned for IOL today. She reports +FMs, No LOF, no VB, no blurry vision, headaches or peripheral edema, and RUQ pain.  She plans on bottle feeding. She request depo for birth control. [facility]  Dating: By 9 week [place]--->  Estimated Date of Delivery: 07/09/22  Sono:    _0 , CWD, normal anatomy, cephalic presentation, 25176HeFW 13%ile  Prenatal History/Complications:  Anemia Low lying placenta - resolved Rubella non immune   Nursing Staff Provider  Office Location  [facility] Dating  LMP c/w 9wk [place] PNC Model _1  Traditional _2  Centering _3  Mom-Baby Dyad    Language   English Anatomy [place]  LL placenta-Resolved  Flu Vaccine  04/16/22 Genetic/Carrier Screen  NIPS:   Low risk female AFP:   Past time Horizon: not performed  TDaP Vaccine   04/16/22 Hgb A1C or  GTT Early 5.4 Third trimester Nml  COVID Vaccine  no   LAB RESULTS   Rhogam  B+ Blood Type B/Positive/-- (06/09 1057) B pos  Baby Feeding Plan  Bottle  Antibody Negative (06/09 1057)neg  Contraception  Depo  Rubella <0.90 (06/09 1057)non imm  Circumcision  N/A RPR Non Reactive (06/09 1057) neg  Pediatrician   [facility] HBsAg Negative (06/09 1057) neg  Support Person  FOB , Mom HCVAb neg  Prenatal Classes Info given HIV Non Reactive (06/09 1057)   neg  BTL Consent  GBS  Negative (For PCN allergy, check sensitivities)   VBAC Consent  N/A Pap  ASCUS, HPV+, rpt pap 02/2023       DME Rx [x ] BP cuff [x ] Weight Scale Waterbirth  _4  Class _5  Consent _6  CNM visit  PHQ9 & GAD7 _7  new OB - 10/13 [[facility]ieves] 28 weeks  [  ] 36 weeks Induction  _8  Orders Entered _9 Foley Y/N    Past Medical History: Past Medical History:  Diagnosis Date   Bronchitis    Elective abortion    History of  umbilical hernia 26073  Medical history non-contributory     Past Surgical History: Past Surgical History:  Procedure Laterality Date   HERNIA REPAIR      Obstetrical History: OB History     Gravida  4   Para  2   Term  2   Preterm  0   AB  1   Living  2      SAB  0   IAB  1   Ectopic      Multiple      Live Births  2           Social History Social History   Socioeconomic History   Marital status: Single    Spouse name: Not on file   Number of children: Not on file   Years of education: Not on file   Highest education level: Not on file  Occupational History   Not on file  Tobacco Use   Smoking status: Former    Types: Cigarettes, Cigars    Quit date: 11/06/2021    Years since quitting: 0.6   Smokeless tobacco: Never  Vaping Use   Vaping Use: Former   Quit date: 08/27/2021   Substances: Flavoring  Substance and Sexual Activity   Alcohol use: No   Drug use: No   Sexual activity: Not Currently    Birth control/protection: None  Other Topics Concern   Not on file  Social History Narrative   Not on file   Social Determinants of Health   Financial Resource Strain: Not on file  Food Insecurity: Food Insecurity Present (07/05/2022)   Hunger Vital Sign    Worried About Running Out of Food in the Last Year: Sometimes true    Ran Out of Food in the Last Year: Sometimes true  Transportation Needs: Unmet Transportation Needs (07/05/2022)   PRAPARE - Hydrologist (Medical): Yes    Lack of Transportation (Non-Medical): Yes  Physical Activity: Not on file  Stress: Not on file  Social Connections: Not on file    Family History: Family History  Problem Relation Age of Onset   Diabetes Mother    Hypertension Mother    Heart failure Mother    Kidney disease Mother    Hernia Father     Allergies: No Known Allergies  Medications Prior to Admission  Medication Sig Dispense Refill Last Dose   Prenatal MV & Min  w/FA-DHA (PRENATAL GUMMIES PO) Take 1 tablet by mouth daily.   07/04/2022   Blood Pressure Monitoring (BLOOD PRESSURE KIT) DEVI 1 Device by Does not apply route as needed. 1 each 0    cetirizine (ZYRTEC) 10 MG tablet Take 1 tablet (10 mg total) by mouth daily. 30 tablet 1    cyclobenzaprine (FLEXERIL) 10 MG tablet Take 1 tablet (10 mg total) by mouth 2 (two) times daily as needed for muscle spasms. (Patient not taking: Reported on 06/25/2022) 60 tablet 0    ferrous sulfate (FERROUSUL) 325 (65 FE) MG tablet Take 1 tablet (325 mg total) by mouth every other day. (Patient not taking: Reported on 05/28/2022) 60 tablet 1    lidocaine (LIDODERM) 5 % Place 2 patches onto the skin daily. Remove & Discard patch within 12 hours or as directed by MD (Patient not taking: Reported on 06/25/2022) 30 patch 0    Magnesium Oxide -Mg Supplement (MAG-OXIDE) 200 MG TABS Take 2 tablets (400 mg total) by mouth at bedtime. If that amount causes loose stools in the am, switch to 252m daily at bedtime. (Patient not taking: Reported on 06/25/2022) 60 tablet 3    metroNIDAZOLE (FLAGYL) 500 MG tablet Take 1 tablet (500 mg total) by mouth 2 (two) times daily. (Patient not taking: Reported on 06/25/2022) 14 tablet 0    Misc. Devices (GOJJI WEIGHT SCALE) MISC 1 Device by Does not apply route as needed. 1 each 0      Review of Systems   All systems reviewed and negative except as stated in HPI  Blood pressure (!) 112/52, pulse 82, temperature 97.7 F (36.5 C), temperature source Oral, resp. rate 17, height _0  (1.549 m), weight 73.1 kg, last menstrual period 10/02/2021, SpO2 99 %, unknown if currently breastfeeding. General appearance: alert, cooperative, and appears stated age Lungs: clear to auscultation bilaterally Heart: regular rate and rhythm Abdomen: soft, non-tender; bowel sounds normal Pelvic: 5/70%/ -2 Extremities: Homans sign is negative, no sign of DVT  Presentation: cephalic Fetal monitoring: 120bpm/ moderate/  +accels, no decels Uterine activity: every 3- 46ms Dilation: Lip/rim Effacement (%): 90 Station: Plus 2 Exam by:: Smalley RN   Prenatal labs: ABO, Rh: --/--/B POS (12/15 2324) Antibody: NEG (12/15 2324) Rubella: <0.90 (06/09 1057) RPR: NON REACTIVE (  12/01 0211)  HBsAg: Negative (06/09 1057)  HIV: Non Reactive (09/26 4462)  GBS: NEGATIVE/-- (12/01 0129)  2 hr Glucola normal Genetic screening  low risk Anatomy US normal  Prenatal Transfer Tool  Maternal Diabetes: No Genetic Screening: Normal Maternal Ultrasounds/Referrals: low lying placenta, resolved in later pregnancy Fetal Ultrasounds or other Referrals:  None Maternal Substance Abuse:  No Significant Maternal Medications:  None Significant Maternal Lab Results:  Group B Strep negative Number of Prenatal Visits:greater than 3 verified prenatal visits Other Comments:  None  Results for orders placed or performed during the hospital encounter of 07/04/22 (from the past 24 hour(s))  CBC   Collection Time: 07/04/22 11:15 PM  Result Value Ref Range   WBC 16.0 (H) 4.0 - 10.5 K/uL   RBC 4.03 3.87 - 5.11 MIL/uL   Hemoglobin 10.9 (L) 12.0 - 15.0 g/dL   HCT 33.8 (L) 36.0 - 46.0 %   MCV 83.9 80.0 - 100.0 fL   MCH 27.0 26.0 - 34.0 pg   MCHC 32.2 30.0 - 36.0 g/dL   RDW 14.6 11.5 - 15.5 %   Platelets 356 150 - 400 K/uL   nRBC 0.0 0.0 - 0.2 %  Type and screen   Collection Time: 07/04/22 11:24 PM  Result Value Ref Range   ABO/RH(D) B POS    Antibody Screen NEG    Sample Expiration      07/07/2022,2359 Performed at [facility], [place] [address] [place], [place] [address] [address]     Patient Active Problem List   Diagnosis Date Noted   Encounter for elective induction of labor 07/05/2022   Uterine contractions 77/05/6578   Anemia complicating pregnancy, third trimester 04/16/2022   Cervical dysplasia 02/27/2022   Low-lying placenta 02/13/2022   Rubella non-immune status, antepartum 12/28/2021   Supervision of low-risk  pregnancy 12/26/2021    Assessment/Plan:  Shawnika Pepin is a 21 y.o. [id number] at 55w3dhere in latent labor, with lots of pain and making cervical change. Plan was for IOL yesterday.  #Labor:admit for labor management. Pitocin started and AROM performed with light meconium staining. #Pain: Desires epidural #FWB: Cat 1 #ID:  GBS neg #MOF: bottle #MOC:depo #Circ:  N/a . female  CLiliane ChannelMD MPH OB Fellow, FNicolletfor [place]12/16/2023

## 2022-07-06 LAB — CBC
HCT: 31.4 % — ABNORMAL LOW (ref 36.0–46.0)
HCT: 28.7 % — ABNORMAL LOW (ref 36.0–46.0)
Hemoglobin: 10.7 g/dL — ABNORMAL LOW (ref 12.0–15.0)
Hemoglobin: 9.5 g/dL — ABNORMAL LOW (ref 12.0–15.0)
MCH: 27.6 pg (ref 26.0–34.0)
MCH: 27.1 pg (ref 26.0–34.0)
MCHC: 34.1 g/dL (ref 30.0–36.0)
MCHC: 33.1 g/dL (ref 30.0–36.0)
MCV: 81.1 fL (ref 80.0–100.0)
MCV: 81.8 fL (ref 80.0–100.0)
Platelets: 299 10*3/uL (ref 150–400)
Platelets: 305 10*3/uL (ref 150–400)
RBC: 3.87 MIL/uL (ref 3.87–5.11)
RBC: 3.51 MIL/uL — ABNORMAL LOW (ref 3.87–5.11)
RDW: 14.6 % (ref 11.5–15.5)
RDW: 14.6 % (ref 11.5–15.5)
WBC: 15.2 10*3/uL — ABNORMAL HIGH (ref 4.0–10.5)
WBC: 12.9 10*3/uL — ABNORMAL HIGH (ref 4.0–10.5)
nRBC: 0 % (ref 0.0–0.2)
nRBC: 0 % (ref 0.0–0.2)

## 2022-07-06 MED ORDER — MEDROXYPROGESTERONE ACETATE 150 MG/ML IM SUSP
150.0000 mg | Freq: Once | INTRAMUSCULAR | Status: AC
  Administered 2022-07-07: 150 mg via INTRAMUSCULAR
  Filled 2022-07-06: qty 1

## 2022-07-06 NOTE — Progress Notes (Signed)
Post Partum Day 1 Subjective: Reports dizziness. up ad lib, voiding, tolerating PO. Bleeding increased yesterday and much less today.   Objective: Blood pressure 117/82, pulse 72, temperature 97.6 F (36.4 C), temperature source Oral, resp. rate 16, height 5\' 1"  (1.549 m), weight 73.1 kg, last menstrual period 10/02/2021, SpO2 98 %, breastfeeding.  Physical Exam:  General: alert, cooperative, appears stated age, and no distress Lochia: appropriate Uterine Fundus: firm @ U Incision: NA DVT Evaluation: No evidence of DVT seen on physical exam.  Recent Labs    07/04/22 2315 07/06/22 0552  HGB 10.9* 10.7*  HCT 33.8* 31.4*    Assessment/Plan: Plan for discharge tomorrow, Breastfeeding, and Contraception Depo prior to D/C. Check CBC now. Venofer PRN Encouraged to empty bladder frequently.    LOS: 1 day   07/08/22, [place] 07/06/2022, 4:47 PM

## 2022-07-07 ENCOUNTER — Other Ambulatory Visit (HOSPITAL_COMMUNITY): Payer: Self-pay

## 2022-07-07 MED ORDER — BENZOCAINE-MENTHOL 20-0.5 % EX AERO
1.0000 | INHALATION_SPRAY | CUTANEOUS | 0 refills | Status: AC | PRN
  Filled 2022-07-07: qty 78, fill #0

## 2022-07-07 MED ORDER — IBUPROFEN 600 MG PO TABS
600.0000 mg | ORAL_TABLET | Freq: Four times a day (QID) | ORAL | 0 refills | Status: DC
  Filled 2022-07-07: qty 30, 8d supply, fill #0

## 2022-07-07 MED ORDER — ACETAMINOPHEN 325 MG PO TABS
650.0000 mg | ORAL_TABLET | ORAL | 0 refills | Status: DC | PRN
  Filled 2022-07-07: qty 60, 5d supply, fill #0

## 2022-07-07 NOTE — Social Work (Signed)
CSW received consult for hx of Depression.  CSW met with MOB to offer support and complete assessment. CSW entered the room,introduced self, CSW role and reason for visit. MOB was agreeable to visit and allowed her mom to remain in the room during the assessment. CSW inquired about how MOB was feeling, MOB reported tired. MOB was pleasant and engaged during the visit.   CSW inquired about MOB MH hx, MOB reported none, MOB reported she does not have depression. CSW inquired about any PPD MOB reported none. CSW assessed for safety, MOB denied any SI or HI. CSW provided education regarding the baby blues period vs. perinatal mood disorders, discussed treatment and gave resources for mental health follow up if concerns arise.  CSW recommends self-evaluation during the postpartum time period using the New Mom Checklist from Postpartum Progress and encouraged MOB to contact a medical professional if symptoms are noted at any time.    CSW inquired about noted food insecurity, MOB reported in the beginning of her pregnancy she had some struggles with getting food but none currently, MOB reported she receives Food stamps. CSW provided MOB the number to schedule a [facility] appointment as well as provided out of the garden resource for fresh food. CSW inquired about lack of transportation, MOB reported she now uses Medicaid non emergent transport. MOB requested a pediatrician list justin case [facility] does not have availability within th next 48 hours. CSW provided pediatricians list.   CSW provided review of Sudden Infant Death Syndrome (SIDS) precautions. MOB reported she has all necessary items for the infant including a crib and bassinet for her to sleep. MOB reported she could use extra diapers, CSW made referral to [facility] for support.  CSW identifies no further need for intervention and no barriers to discharge at this time.  Letta Kocher, [place] Social Worker [phone]

## 2022-07-16 ENCOUNTER — Telehealth: Payer: Self-pay | Admitting: Family Medicine

## 2022-07-16 ENCOUNTER — Telehealth (HOSPITAL_COMMUNITY): Payer: Self-pay | Admitting: *Deleted

## 2022-07-16 NOTE — Telephone Encounter (Signed)
Called pt and pt requested to get a return to work note for 08/01/22.  Pt reports that she needs to go back to work.  Pt endorses that she does not have any concerns at this time and that she will come to pp visit on 08/25/22.   Per chart review, pt had a vaginal delivery on 07/05/22 with no laceration.    Addison Naegeli, RN  07/16/22

## 2022-07-16 NOTE — Telephone Encounter (Signed)
Patient called in wanting to return to work on 1/12. 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 She is wanting to now if she can be cleared early.

## 2022-07-16 NOTE — Telephone Encounter (Signed)
Mom reports feeling good. No concerns about herself at this time. EPDS=0 [facility] score=3) Mom reports baby is having some mucus and coughing like a cold. Suggested mom call peds office for guidance. She agreed. Feeding, peeing, and pooping without difficulty. Safe sleep reviewed. Mom reports no other concerns about baby at present.  Duffy Rhody, RN 07-16-2022 at 4:09pm

## 2022-08-25 ENCOUNTER — Ambulatory Visit: Payer: Medicaid Other | Admitting: Family Medicine

## 2022-09-10 ENCOUNTER — Ambulatory Visit (HOSPITAL_COMMUNITY)
Admission: EM | Admit: 2022-09-10 | Discharge: 2022-09-10 | Disposition: A | Discharge status: Home / Self Care | Payer: Medicaid Other | Attending: Internal Medicine | Admitting: Internal Medicine

## 2022-09-10 ENCOUNTER — Other Ambulatory Visit: Payer: Self-pay

## 2022-09-10 ENCOUNTER — Encounter (HOSPITAL_COMMUNITY): Payer: Self-pay | Admitting: Emergency Medicine

## 2022-09-10 DIAGNOSIS — J069 Acute upper respiratory infection, unspecified: Secondary | ICD-10-CM | POA: Diagnosis not present

## 2022-09-10 DIAGNOSIS — Z1152 Encounter for screening for COVID-19: Secondary | ICD-10-CM | POA: Insufficient documentation

## 2022-09-10 DIAGNOSIS — R509 Fever, unspecified: Secondary | ICD-10-CM | POA: Insufficient documentation

## 2022-09-10 MED ORDER — PROMETHAZINE-DM 6.25-15 MG/5ML PO SYRP: 5.0000 mL | ORAL_SOLUTION | Freq: Every evening | ORAL | 0 refills | Status: AC | PRN

## 2022-09-10 MED ORDER — IBUPROFEN 800 MG PO TABS
800.0000 mg | ORAL_TABLET | Freq: Once | ORAL | Status: AC
  Administered 2022-09-10: 800 mg via ORAL

## 2022-09-10 MED ORDER — IBUPROFEN 800 MG PO TABS
ORAL_TABLET | ORAL | Status: AC
  Filled 2022-09-10: qty 1

## 2022-09-10 MED ORDER — BENZONATATE 100 MG PO CAPS: 100.0000 mg | ORAL_CAPSULE | Freq: Three times a day (TID) | ORAL | 0 refills | Status: DC

## 2022-09-10 MED ORDER — ACETAMINOPHEN 325 MG PO TABS
650.0000 mg | ORAL_TABLET | Freq: Once | ORAL | Status: AC
  Administered 2022-09-10: 650 mg via ORAL

## 2022-09-10 MED ORDER — ACETAMINOPHEN 325 MG PO TABS
ORAL_TABLET | ORAL | Status: AC
  Filled 2022-09-10: qty 1

## 2022-09-10 NOTE — ED Provider Notes (Signed)
[name]    CSN: [id number] Arrival date & time: 09/10/22  1510      History   Chief Complaint Chief Complaint  Patient presents with   Cough    HPI Samantha Peters is a 22 y.o. female.   Patient presents to urgent care for evaluation of cough, nasal congestion, fever/chills, and generalized bodyaches for the last 4 days since Saturday, February 17th 2024.  She also is experiencing generalized headache with sore throat.  Denies rash, nausea, vomiting, abdominal pain, flank pain, urinary symptoms, chest pain, shortness of breath, dizziness, wheezing, and heart palpitations.  Unknown highest temperature at home due to lack of thermometer.  Recently gave birth to a baby in December 2023 (3 months ago).  Denies leg swelling and orthopnea.  No recent antibiotic or steroid use.  She is not breast-feeding and states that her whole house is sick with similar symptoms.  She is not a smoker and denies drug use.  Denies history of chronic respiratory problems. She has been using over-the-counter medications without relief of symptoms.  No recent antipyretic medications use.   Cough   Past Medical History:  Diagnosis Date   Bronchitis    Elective abortion    History of umbilical hernia [id number]   Medical history non-contributory     Patient Active Problem List   Diagnosis Date Noted   Encounter for elective induction of labor 07/05/2022   Uterine contractions [id number]   Anemia complicating pregnancy, third trimester 04/16/2022   Cervical dysplasia 02/27/2022   Low-lying placenta 02/13/2022   Rubella non-immune status, antepartum 12/28/2021   Supervision of low-risk pregnancy 12/26/2021    Past Surgical History:  Procedure Laterality Date   HERNIA REPAIR      OB History     Gravida  4   Para  3   Term  3   Preterm  0   AB  1   Living  3      SAB  0   IAB  1   Ectopic      Multiple  0   Live Births  3            Home Medications    Prior  to Admission medications   Medication Sig Start Date End Date Taking? Authorizing Provider  benzonatate (TESSALON) 100 MG capsule Take 1 capsule (100 mg total) by mouth every 8 (eight) hours. 09/10/22  Yes Talbot Grumbling, FNP  promethazine-dextromethorphan (PROMETHAZINE-DM) 6.25-15 MG/5ML syrup Take 5 mLs by mouth at bedtime as needed for cough. 09/10/22  Yes Talbot Grumbling, FNP  acetaminophen (TYLENOL) 325 MG tablet Take 2 tablets (650 mg total) by mouth every 4 (four) hours as needed (for pain scale < 4). Patient not taking: Reported on 09/10/2022 07/07/22   Concepcion Living, MD  benzocaine-Menthol (DERMOPLAST) 20-0.5 % AERO Apply 1 Application topically as needed for irritation (perineal discomfort). Patient not taking: Reported on 09/10/2022 07/07/22   Concepcion Living, MD  Blood Pressure Monitoring (BLOOD PRESSURE KIT) DEVI 1 Device by Does not apply route as needed. 12/26/21   Aletha Halim, MD  cetirizine (ZYRTEC) 10 MG tablet Take 1 tablet (10 mg total) by mouth daily. Patient not taking: Reported on 09/10/2022 11/01/21   Leath-Warren, Alda Lea, NP  ferrous sulfate (FERROUSUL) 325 (65 FE) MG tablet Take 1 tablet (325 mg total) by mouth every other day. Patient not taking: Reported on 05/28/2022 04/16/22   Caren Macadam, MD  ibuprofen (ADVIL) 600 MG  tablet Take 1 tablet (600 mg total) by mouth every 6 (six) hours. Patient not taking: Reported on 09/10/2022 07/07/22   Concepcion Living, MD  lidocaine (LIDODERM) 5 % Place 2 patches onto the skin daily. Remove & Discard patch within 12 hours or as directed by MD Patient not taking: Reported on 06/25/2022 06/23/22   Gabriel Carina, CNM  Magnesium Oxide -Mg Supplement (MAG-OXIDE) 200 MG TABS Take 2 tablets (400 mg total) by mouth at bedtime. If that amount causes loose stools in the am, switch to 28m daily at bedtime. Patient not taking: Reported on 06/25/2022 06/22/22   WGabriel Carina CNM  Prenatal MV & Min w/FA-DHA  (PRENATAL GUMMIES PO) Take 1 tablet by mouth daily. Patient not taking: Reported on 09/10/2022    [provider]    Family History Family History  Problem Relation Age of Onset   Diabetes Mother    Hypertension Mother    Heart failure Mother    Kidney disease Mother    Hernia Father     Social History Social History   Tobacco Use   Smoking status: Former    Types: Cigarettes, Cigars    Quit date: 11/06/2021    Years since quitting: 0.8   Smokeless tobacco: Never  Vaping Use   Vaping Use: Former   Quit date: 08/27/2021   Substances: Flavoring  Substance Use Topics   Alcohol use: No   Drug use: No     Allergies   Patient has no known allergies.   Review of Systems Review of Systems  Respiratory:  Positive for cough.   Per HPI   Physical Exam Triage Vital Signs ED Triage Vitals  Enc Vitals Group     BP 09/10/22 1609 131/89     Pulse Rate 09/10/22 1609 (!) 126     Resp 09/10/22 1609 18     Temp 09/10/22 1609 (!) 100.8 F (38.2 C)     Temp Source 09/10/22 1609 Oral     SpO2 09/10/22 1609 96 %     Weight --      Height --      Head Circumference --      Peak Flow --      Pain Score 09/10/22 1607 8     Pain Loc --      Pain Edu? --      Excl. in [place] --    No data found.  Updated Vital Signs BP 131/89 (BP Location: Left Arm)   Pulse (!) 126   Temp (!) 102.9 F (39.4 C) Comment: per Zalika Tieszen, np  Resp 18   LMP 10/02/2021 (Within Days)   SpO2 96%   Visual Acuity Right Eye Distance:   Left Eye Distance:   Bilateral Distance:    Right Eye Near:   Left Eye Near:    Bilateral Near:     Physical Exam Vitals and nursing note reviewed.  Constitutional:      Appearance: She is ill-appearing. She is not toxic-appearing.  HENT:     Head: Normocephalic and atraumatic.     Right Ear: Hearing, tympanic membrane, ear canal and external ear normal.     Left Ear: Hearing, tympanic membrane, ear canal and external ear normal.     Nose: Congestion  present.     Mouth/Throat:     Lips: Pink.     Mouth: Mucous membranes are moist.     Pharynx: Posterior oropharyngeal erythema present.  Eyes:     General:  Lids are normal. Vision grossly intact. Gaze aligned appropriately.     Extraocular Movements: Extraocular movements intact.     Conjunctiva/sclera: Conjunctivae normal.  Cardiovascular:     Rate and Rhythm: Normal rate and regular rhythm.     Heart sounds: Normal heart sounds, S1 normal and S2 normal.  Pulmonary:     Effort: Pulmonary effort is normal. No respiratory distress.     Breath sounds: Normal breath sounds and air entry. No wheezing, rhonchi or rales.  Abdominal:     General: Bowel sounds are normal.     Palpations: Abdomen is soft.     Tenderness: There is no abdominal tenderness. There is no right CVA tenderness, left CVA tenderness or guarding.  Musculoskeletal:     Cervical back: Neck supple.     Right lower leg: No edema.     Left lower leg: No edema.  Lymphadenopathy:     Cervical: Cervical adenopathy present.  Skin:    General: Skin is warm and dry.     Capillary Refill: Capillary refill takes less than 2 seconds.     Findings: No rash.  Neurological:     General: No focal deficit present.     Mental Status: She is alert and oriented to person, place, and time. Mental status is at baseline.     Cranial Nerves: No dysarthria or facial asymmetry.     Motor: No weakness.     Gait: Gait normal.  Psychiatric:        Mood and Affect: Mood normal.        Speech: Speech normal.        Behavior: Behavior normal.        Thought Content: Thought content normal.        Judgment: Judgment normal.      UC Treatments / Results  Labs (all labs ordered are listed, but only abnormal results are displayed) Labs Reviewed  SARS CORONAVIRUS 2 (TAT 6-24 HRS)    EKG   Radiology No results found.  Procedures Procedures (including critical care time)  Medications Ordered in UC Medications  acetaminophen  (TYLENOL) tablet 650 mg (650 mg Oral Given 09/10/22 1615)  ibuprofen (ADVIL) tablet 800 mg (800 mg Oral Given 09/10/22 1703)    Initial Impression / Assessment and Plan / UC Course  I have reviewed the triage vital signs and the nursing notes.  Pertinent labs & imaging results that were available during my care of the patient were reviewed by me and considered in my medical decision making (see chart for details).   1. Viral URI with cough Symptoms and physical exam consistent with a viral upper respiratory tract infection that will likely resolve with rest, fluids, and prescriptions for symptomatic relief. Deferred imaging based on stable cardiopulmonary exam and hemodynamically stable vital signs.  COVID-19 testing is pending.  We will call patient if this is positive.  Quarantine guidelines discussed. Currently on day 5 of symptoms and does not qualify for antiviral therapy.   Patient given tylenol 635m and ibuprofen 8062min clinic today for fever/abdominal pain. Tessalon perles and promethazine DM sent to pharmacy for symptomatic relief to be taken as prescribed.  May continue taking over the counter medications as directed for further symptomatic relief.  Drowsiness precautions discussed regarding promethazine DM prescription.  Nonpharmacologic interventions for symptom relief provided and after visit summary below. Advised to push fluids to stay well hydrated while recovering from viral illness. Heart rate likely elevated due to fever, advised to  push fluids.  Discussed physical exam and available lab work findings in clinic with patient.  Counseled patient regarding appropriate use of medications and potential side effects for all medications recommended or prescribed today. Discussed red flag signs and symptoms of worsening condition,when to call the PCP office, return to urgent care, and when to seek higher level of care in the emergency department. Patient verbalizes understanding and  agreement with plan. All questions answered. Patient discharged in stable condition.    Final Clinical Impressions(s) / UC Diagnoses   Final diagnoses:  Viral URI with cough  Fever, unspecified     Discharge Instructions      You have a viral upper respiratory infection.  COVID-19 testing is pending. We will call you with results if positive. If your COVID test is positive, you must stay at home until day 6 of symptoms. On day 6, you may go out into public and go back to work, but you must wear a mask until day 11 of symptoms to prevent spread to others.  Use the following medicines to help with symptoms: - Plain Mucinex (guaifenesin) over the counter as directed every 12 hours to thin mucous so that you are able to get it out of your body easier. Drink plenty of water while taking this medication so that it works well in your body (at least 8 cups a day).  - Tylenol 1,051m and/or ibuprofen 607mevery 6 hours with food as needed for aches/pains or fever/chills.  - Tessalon perles every 8 hours as needed for cough. - Take Promethazine DM cough medication to help with your cough at nighttime so that you are able to sleep. Do not drive, drink alcohol, or go to work while taking this medication since it can make you sleepy. Only take this at nighttime.   1 tablespoon of honey in warm water and/or salt water gargles may also help with symptoms. Humidifier to your room will help add water to the air and reduce coughing.  If you develop any new or worsening symptoms, please return.  If your symptoms are severe, please go to the emergency room.  Follow-up with your primary care provider for further evaluation and management of your symptoms as well as ongoing wellness visits.  I hope you feel better!    ED Prescriptions     Medication Sig Dispense Auth. Provider   benzonatate (TESSALON) 100 MG capsule Take 1 capsule (100 mg total) by mouth every 8 (eight) hours. 21 capsule StTalbot GrumblingFNP   promethazine-dextromethorphan (PROMETHAZINE-DM) 6.25-15 MG/5ML syrup Take 5 mLs by mouth at bedtime as needed for cough. 118 mL StTalbot GrumblingFNP      PDMP not reviewed this encounter.   StTalbot GrumblingFNNorth [place]2/21/24 1723

## 2022-09-10 NOTE — ED Triage Notes (Signed)
Reports a cough, sore throat and rib pain that started Saturday.  Patient has generalized body aches and pain.  Patient has had a runny nose.  Complains fo generalized weakness  Has had robitussin, tylenol

## 2022-09-10 NOTE — Discharge Instructions (Addendum)
You have a viral upper respiratory infection.  COVID-19 testing is pending. We will call you with results if positive. If your COVID test is positive, you must stay at home until day 6 of symptoms. On day 6, you may go out into public and go back to work, but you must wear a mask until day 11 of symptoms to prevent spread to others.  Use the following medicines to help with symptoms: - Plain Mucinex (guaifenesin) over the counter as directed every 12 hours to thin mucous so that you are able to get it out of your body easier. Drink plenty of water while taking this medication so that it works well in your body (at least 8 cups a day).  - Tylenol 1,052m and/or ibuprofen 6063mevery 6 hours with food as needed for aches/pains or fever/chills.  - Tessalon perles every 8 hours as needed for cough. - Take Promethazine DM cough medication to help with your cough at nighttime so that you are able to sleep. Do not drive, drink alcohol, or go to work while taking this medication since it can make you sleepy. Only take this at nighttime.   1 tablespoon of honey in warm water and/or salt water gargles may also help with symptoms. Humidifier to your room will help add water to the air and reduce coughing.  If you develop any new or worsening symptoms, please return.  If your symptoms are severe, please go to the emergency room.  Follow-up with your primary care provider for further evaluation and management of your symptoms as well as ongoing wellness visits.  I hope you feel better!

## 2022-09-11 LAB — SARS CORONAVIRUS 2 (TAT 6-24 HRS): SARS Coronavirus 2: NEGATIVE

## 2022-09-24 ENCOUNTER — Encounter: Payer: Self-pay | Admitting: *Deleted

## 2022-10-03 ENCOUNTER — Encounter: Payer: Self-pay | Admitting: *Deleted

## 2022-10-09 ENCOUNTER — Ambulatory Visit: Payer: Medicaid Other | Admitting: Family Medicine

## 2022-11-26 ENCOUNTER — Other Ambulatory Visit: Payer: Self-pay | Admitting: Nurse Practitioner

## 2022-11-26 DIAGNOSIS — R102 Pelvic and perineal pain: Secondary | ICD-10-CM

## 2022-12-26 ENCOUNTER — Other Ambulatory Visit: Payer: Medicaid Other

## 2022-12-29 ENCOUNTER — Other Ambulatory Visit: Payer: Medicaid Other

## 2023-08-09 ENCOUNTER — Emergency Department (HOSPITAL_COMMUNITY)
Admission: EM | Admit: 2023-08-09 | Discharge: 2023-08-10 | Disposition: A | Discharge status: Home / Self Care | Payer: Medicaid Other | Attending: Emergency Medicine | Admitting: Emergency Medicine

## 2023-08-09 ENCOUNTER — Emergency Department (HOSPITAL_COMMUNITY): Payer: Medicaid Other

## 2023-08-09 ENCOUNTER — Encounter (HOSPITAL_COMMUNITY): Payer: Self-pay

## 2023-08-09 ENCOUNTER — Other Ambulatory Visit: Payer: Self-pay

## 2023-08-09 DIAGNOSIS — J101 Influenza due to other identified influenza virus with other respiratory manifestations: Secondary | ICD-10-CM | POA: Diagnosis not present

## 2023-08-09 DIAGNOSIS — E871 Hypo-osmolality and hyponatremia: Secondary | ICD-10-CM | POA: Insufficient documentation

## 2023-08-09 DIAGNOSIS — Z20822 Contact with and (suspected) exposure to covid-19: Secondary | ICD-10-CM | POA: Diagnosis not present

## 2023-08-09 DIAGNOSIS — R000 Tachycardia, unspecified: Secondary | ICD-10-CM | POA: Insufficient documentation

## 2023-08-09 DIAGNOSIS — R0602 Shortness of breath: Secondary | ICD-10-CM | POA: Diagnosis present

## 2023-08-09 LAB — CBC
HCT: 38.6 % (ref 36.0–46.0)
Hemoglobin: 12.7 g/dL (ref 12.0–15.0)
MCH: 26.9 pg (ref 26.0–34.0)
MCHC: 32.9 g/dL (ref 30.0–36.0)
MCV: 81.8 fL (ref 80.0–100.0)
Platelets: 303 10*3/uL (ref 150–400)
RBC: 4.72 MIL/uL (ref 3.87–5.11)
RDW: 13 % (ref 11.5–15.5)
WBC: 9.4 10*3/uL (ref 4.0–10.5)
nRBC: 0 % (ref 0.0–0.2)

## 2023-08-09 LAB — BASIC METABOLIC PANEL
Anion gap: 12 (ref 5–15)
BUN: 11 mg/dL (ref 6–20)
CO2: 20 mmol/L — ABNORMAL LOW (ref 22–32)
Calcium: 9.6 mg/dL (ref 8.9–10.3)
Chloride: 102 mmol/L (ref 98–111)
Creatinine, Ser: 0.59 mg/dL (ref 0.44–1.00)
GFR, Estimated: >60 mL/min (ref >60)
Glucose, Bld: 102 mg/dL — ABNORMAL HIGH (ref 70–99)
Potassium: 3.7 mmol/L (ref 3.5–5.1)
Sodium: 134 mmol/L — ABNORMAL LOW (ref 135–145)

## 2023-08-09 LAB — RESP PANEL BY RT-PCR (RSV, FLU A&B, COVID)  RVPGX2
Influenza A by PCR: POSITIVE — AB
Influenza B by PCR: NEGATIVE
Resp Syncytial Virus by PCR: NEGATIVE
SARS Coronavirus 2 by RT PCR: NEGATIVE

## 2023-08-09 LAB — TROPONIN I (HIGH SENSITIVITY): Troponin I (High Sensitivity): <2 ng/L (ref <18)

## 2023-08-09 LAB — HCG, SERUM, QUALITATIVE: Preg, Serum: NEGATIVE

## 2023-08-09 MED ORDER — GUAIFENESIN ER 600 MG PO TB12: 1200.0000 mg | ORAL_TABLET | Freq: Two times a day (BID) | ORAL | 0 refills | Status: AC

## 2023-08-09 MED ORDER — BENZONATATE 100 MG PO CAPS: 100.0000 mg | ORAL_CAPSULE | Freq: Three times a day (TID) | ORAL | 0 refills | Status: AC

## 2023-08-09 MED ORDER — BENZONATATE 100 MG PO CAPS
100.0000 mg | ORAL_CAPSULE | Freq: Three times a day (TID) | ORAL | 0 refills | Status: DC
  Filled 2023-08-09: qty 21, 7d supply, fill #0

## 2023-08-09 MED ORDER — ACETAMINOPHEN 325 MG PO TABS
650.0000 mg | ORAL_TABLET | Freq: Four times a day (QID) | ORAL | 0 refills | Status: DC | PRN
  Filled 2023-08-09: qty 40, 5d supply, fill #0

## 2023-08-09 MED ORDER — ACETAMINOPHEN 325 MG PO TABS: 650.0000 mg | ORAL_TABLET | Freq: Four times a day (QID) | ORAL | 0 refills | Status: AC | PRN

## 2023-08-09 MED ORDER — GUAIFENESIN ER 600 MG PO TB12
1200.0000 mg | ORAL_TABLET | Freq: Two times a day (BID) | ORAL | 0 refills | Status: DC
  Filled 2023-08-09: qty 40, 10d supply, fill #0

## 2023-08-09 MED ORDER — IBUPROFEN 800 MG PO TABS
800.0000 mg | ORAL_TABLET | Freq: Once | ORAL | Status: AC
  Administered 2023-08-09: 800 mg via ORAL
  Filled 2023-08-09: qty 1

## 2023-08-09 MED ORDER — ACETAMINOPHEN 325 MG PO TABS
650.0000 mg | ORAL_TABLET | Freq: Once | ORAL | Status: AC
  Administered 2023-08-09: 650 mg via ORAL
  Filled 2023-08-09: qty 2

## 2023-08-09 MED ORDER — IBUPROFEN 800 MG PO TABS: 800.0000 mg | ORAL_TABLET | Freq: Three times a day (TID) | ORAL | 0 refills | Status: AC | PRN

## 2023-08-09 MED ORDER — IBUPROFEN 800 MG PO TABS
800.0000 mg | ORAL_TABLET | Freq: Three times a day (TID) | ORAL | 0 refills | Status: DC | PRN
  Filled 2023-08-09: qty 30, 10d supply, fill #0

## 2023-08-09 NOTE — ED Provider Notes (Signed)
[facility] EMERGENCY DEPARTMENT AT [facility] Provider Note   CSN: [id number] Arrival date & time: 08/09/23  2145     History  Chief Complaint  Patient presents with   Shortness of Breath    Samantha Peters is a 23 y.o. female past medical history of bronchitis presents today for cough and shortness of breath since yesterday.  She also endorses fever, chills, body aches, congestion, chest tenderness and ear pain.  Patient denies numbness, weakness, sore throat, vomiting, nausea, diarrhea, or abdominal pain.   Shortness of Breath Associated symptoms: cough, ear pain and fever        Home Medications Prior to Admission medications   Medication Sig Start Date End Date Taking? Authorizing Provider  acetaminophen (TYLENOL) 325 MG tablet Take 2 tablets (650 mg total) by mouth every 6 (six) hours as needed for up to 10 days. 08/09/23 08/19/23  Dolphus Jenny, PA-C  benzocaine-Menthol (DERMOPLAST) 20-0.5 % AERO Apply 1 Application topically as needed for irritation (perineal discomfort). Patient not taking: Reported on 09/10/2022 07/07/22   Celedonio Savage, MD  benzonatate (TESSALON) 100 MG capsule Take 1 capsule (100 mg total) by mouth every 8 (eight) hours. 08/09/23   Dolphus Jenny, PA-C  Blood Pressure Monitoring (BLOOD PRESSURE KIT) DEVI 1 Device by Does not apply route as needed. 12/26/21   Roseland Bing, MD  cetirizine (ZYRTEC) 10 MG tablet Take 1 tablet (10 mg total) by mouth daily. Patient not taking: Reported on 09/10/2022 11/01/21   Leath-Warren, Sadie Haber, NP  ferrous sulfate (FERROUSUL) 325 (65 FE) MG tablet Take 1 tablet (325 mg total) by mouth every other day. Patient not taking: Reported on 05/28/2022 04/16/22   Federico Flake, MD  guaiFENesin (MUCINEX) 600 MG 12 hr tablet Take 2 tablets (1,200 mg total) by mouth 2 (two) times daily for 10 days. 08/09/23 08/19/23  Dolphus Jenny, PA-C  ibuprofen (ADVIL) 800 MG tablet Take 1 tablet (800 mg total) by mouth every  8 (eight) hours as needed for up to 10 days. 08/09/23 08/19/23  Dolphus Jenny, PA-C  lidocaine (LIDODERM) 5 % Place 2 patches onto the skin daily. Remove & Discard patch within 12 hours or as directed by MD Patient not taking: Reported on 06/25/2022 06/23/22   Bernerd Limbo, CNM  Magnesium Oxide -Mg Supplement (MAG-OXIDE) 200 MG TABS Take 2 tablets (400 mg total) by mouth at bedtime. If that amount causes loose stools in the am, switch to 200mg  daily at bedtime. Patient not taking: Reported on 06/25/2022 06/22/22   Bernerd Limbo, CNM  Prenatal MV & Min w/FA-DHA (PRENATAL GUMMIES PO) Take 1 tablet by mouth daily. Patient not taking: Reported on 09/10/2022    [provider]  promethazine-dextromethorphan (PROMETHAZINE-DM) 6.25-15 MG/5ML syrup Take 5 mLs by mouth at bedtime as needed for cough. 09/10/22   Carlisle Beers, FNP      Allergies    Patient has no known allergies.    Review of Systems   Review of Systems  Constitutional:  Positive for chills and fever.  HENT:  Positive for congestion and ear pain.   Respiratory:  Positive for cough and shortness of breath.     Physical Exam Updated Vital Signs BP 126/78 (BP Location: Right Arm)   Pulse (!) 107   Temp 98.4 F (36.9 C) (Oral)   Resp 20   Ht 5\' 1"  (1.549 m)   Wt 86.2 kg   LMP 07/18/2023 (Approximate)   SpO2 100%  BMI 35.90 kg/m  Physical Exam Vitals and nursing note reviewed.  Constitutional:      General: She is not in acute distress.    Appearance: She is well-developed. She is ill-appearing. She is not toxic-appearing or diaphoretic.  HENT:     Head: Normocephalic and atraumatic.     Right Ear: Tympanic membrane and external ear normal.     Left Ear: Tympanic membrane and external ear normal.  Eyes:     Extraocular Movements: Extraocular movements intact.     Conjunctiva/sclera: Conjunctivae normal.     Pupils: Pupils are equal, round, and reactive to light.  Cardiovascular:     Rate and  Rhythm: Regular rhythm. Tachycardia present.     Pulses: Normal pulses.     Heart sounds: Normal heart sounds. No murmur heard.    Comments: Mildly tachycardic on exam Pulmonary:     Effort: Pulmonary effort is normal. No tachypnea or respiratory distress.     Breath sounds: Normal breath sounds. No wheezing, rhonchi or rales.  Abdominal:     Palpations: Abdomen is soft.     Tenderness: There is no abdominal tenderness.  Musculoskeletal:        General: No swelling.     Cervical back: Neck supple.     Right lower leg: No tenderness.  Skin:    General: Skin is warm and dry.     Capillary Refill: Capillary refill takes less than 2 seconds.  Neurological:     General: No focal deficit present.     Mental Status: She is alert.     Motor: No weakness.  Psychiatric:        Mood and Affect: Mood normal.     ED Results / Procedures / Treatments   Labs (all labs ordered are listed, but only abnormal results are displayed) Labs Reviewed  RESP PANEL BY RT-PCR (RSV, FLU A&B, COVID)  RVPGX2 - Abnormal; Notable for the following components:      Result Value   Influenza A by PCR POSITIVE (*)    All other components within normal limits  BASIC METABOLIC PANEL - Abnormal; Notable for the following components:   Sodium 134 (*)    CO2 20 (*)    Glucose, Bld 102 (*)    All other components within normal limits  CBC  HCG, SERUM, QUALITATIVE  TROPONIN I (HIGH SENSITIVITY)    EKG None  Radiology DG Chest 2 View Result Date: 08/09/2023 CLINICAL DATA:  Short of breath, chest pain, cough, bronchitis EXAM: CHEST - 2 VIEW COMPARISON:  None Available. FINDINGS: The heart size and mediastinal contours are within normal limits. Both lungs are clear. The visualized skeletal structures are unremarkable. IMPRESSION: No active cardiopulmonary disease. Electronically Signed   By: Sharlet Salina M.D.   On: 08/09/2023 22:40    Procedures Procedures    Medications Ordered in ED Medications   acetaminophen (TYLENOL) tablet 650 mg (650 mg Oral Given 08/09/23 2238)  ibuprofen (ADVIL) tablet 800 mg (800 mg Oral Given 08/09/23 2328)    ED Course/ Medical Decision Making/ A&P                                 Medical Decision Making Amount and/or Complexity of Data Reviewed Labs: ordered. Radiology: ordered.  Risk OTC drugs.   This patient presents to the ED with chief complaint(s) of shortness of breath and cough with pertinent past medical history of bronchitis  which further complicates the presenting complaint. The complaint involves an extensive differential diagnosis and also carries with it a high risk of complications and morbidity.    The differential diagnosis includes COVID, flu, RSV, bronchitis, pneumonia  Additional history obtained: Records reviewed Care Everywhere/External Records  ED Course and Reassessment:   Independent labs interpretation:  The following labs were independently interpreted:  Respiratory panel: Influenza A CBC: No notable findings BMP: Mild hyponatremia at 134, mildly decreased CO2 at 20 hCG serum: Negative Troponin: <2 EKG: Sinus tachycardia, Probable left atrial enlargement, Borderline right axis deviation  Ambulatory pulse ox: Patient's heart rate did elevate while ambulating but she maintained pulse ox above 98% on room air   Independent visualization of imaging: - I independently visualized the following imaging with scope of interpretation limited to determining acute life threatening conditions related to emergency care: Chest x-ray, which revealed no active cardiopulmonary disease  Consultation: - Consulted or discussed management/test interpretation w/ external professional: None  Consideration for admission or further workup: Considered for admission or further workup however patient's vital signs, physical exam, labs, and imaging are reassuring.  Patient's mild tachycardia likely due to influenza A infection.  Patient able  to maintain oxygen on room air when ambulating.  Patient tolerating p.o. intake prior to discharge with no difficulty.  Patient should alternate Tylenol and Motrin for pain and fever, Flonase for congestion, Mucinex for congestion, and Robitussin for cough.  Patient should follow-up with primary care physician for further evaluation and treatment if her symptoms persist.         Final Clinical Impression(s) / ED Diagnoses Final diagnoses:  Influenza A    Rx / DC Orders ED Discharge Orders          Ordered    acetaminophen (TYLENOL) 325 MG tablet  Every 6 hours PRN,   Status:  Discontinued        08/09/23 2346    ibuprofen (ADVIL) 800 MG tablet  Every 8 hours PRN,   Status:  Discontinued        08/09/23 2346    guaiFENesin (MUCINEX) 600 MG 12 hr tablet  2 times daily,   Status:  Discontinued        08/09/23 2346    benzonatate (TESSALON) 100 MG capsule  Every 8 hours,   Status:  Discontinued        08/09/23 2346    acetaminophen (TYLENOL) 325 MG tablet  Every 6 hours PRN        08/09/23 2348    benzonatate (TESSALON) 100 MG capsule  Every 8 hours        08/09/23 2348    guaiFENesin (MUCINEX) 600 MG 12 hr tablet  2 times daily        08/09/23 2348    ibuprofen (ADVIL) 800 MG tablet  Every 8 hours PRN        08/09/23 2348              Dolphus Jenny, PA-C 08/09/23 2354    Bethann Berkshire, MD 08/10/23 1142

## 2023-08-09 NOTE — ED Triage Notes (Signed)
SOB with cough since yesterday. Pt states she was diagnosed with bronchitis. C/o midsternal chest pressure that sometimes radiates depending on cough.

## 2023-08-09 NOTE — Discharge Instructions (Addendum)
They were seen for influenza A.  Thank you for letting us treat you today. After reviewing your labs and imaging, I feel you are safe to go home. Please follow up with your PCP in the next several days and provide them with your records from this visit. Return to the Emergency Room if pain becomes severe or symptoms worsen.

## 2023-08-10 ENCOUNTER — Other Ambulatory Visit (HOSPITAL_COMMUNITY): Payer: Self-pay

## 2023-08-20 ENCOUNTER — Other Ambulatory Visit: Payer: Medicaid Other
